# Patient Record
Sex: Male | Born: 1955 | Race: White | Hispanic: No
Health system: Southern US, Community
[De-identification: ages and names within clinical notes are randomized; demographics above are authoritative.]

## PROBLEM LIST (undated history)

## (undated) DIAGNOSIS — J9621 Acute and chronic respiratory failure with hypoxia: Secondary | ICD-10-CM

## (undated) DIAGNOSIS — I480 Paroxysmal atrial fibrillation: Secondary | ICD-10-CM

## (undated) DIAGNOSIS — N170 Acute kidney failure with tubular necrosis: Secondary | ICD-10-CM

## (undated) DIAGNOSIS — J850 Gangrene and necrosis of lung: Secondary | ICD-10-CM

## (undated) DIAGNOSIS — K4030 Unilateral inguinal hernia, with obstruction, without gangrene, not specified as recurrent: Secondary | ICD-10-CM

---

## 2018-12-14 ENCOUNTER — Encounter: Payer: Self-pay | Admitting: Internal Medicine

## 2018-12-14 ENCOUNTER — Other Ambulatory Visit (HOSPITAL_COMMUNITY): Payer: Self-pay

## 2018-12-14 ENCOUNTER — Inpatient Hospital Stay
Admission: AD | Admit: 2018-12-14 | Discharge: 2019-02-04 | Disposition: A | Discharge status: Other Acute Inpatient Hospital | Payer: BLUE CROSS/BLUE SHIELD | Source: Other Acute Inpatient Hospital | Attending: Internal Medicine | Admitting: Internal Medicine

## 2018-12-14 DIAGNOSIS — Z95828 Presence of other vascular implants and grafts: Secondary | ICD-10-CM

## 2018-12-14 DIAGNOSIS — I480 Paroxysmal atrial fibrillation: Secondary | ICD-10-CM | POA: Diagnosis present

## 2018-12-14 DIAGNOSIS — Z09 Encounter for follow-up examination after completed treatment for conditions other than malignant neoplasm: Secondary | ICD-10-CM

## 2018-12-14 DIAGNOSIS — R6520 Severe sepsis without septic shock: Secondary | ICD-10-CM

## 2018-12-14 DIAGNOSIS — A419 Sepsis, unspecified organism: Secondary | ICD-10-CM

## 2018-12-14 DIAGNOSIS — R188 Other ascites: Secondary | ICD-10-CM

## 2018-12-14 DIAGNOSIS — J9811 Atelectasis: Secondary | ICD-10-CM

## 2018-12-14 DIAGNOSIS — J90 Pleural effusion, not elsewhere classified: Secondary | ICD-10-CM

## 2018-12-14 DIAGNOSIS — J850 Gangrene and necrosis of lung: Secondary | ICD-10-CM | POA: Diagnosis not present

## 2018-12-14 DIAGNOSIS — R0902 Hypoxemia: Secondary | ICD-10-CM

## 2018-12-14 DIAGNOSIS — R0603 Acute respiratory distress: Secondary | ICD-10-CM

## 2018-12-14 DIAGNOSIS — J9690 Respiratory failure, unspecified, unspecified whether with hypoxia or hypercapnia: Secondary | ICD-10-CM

## 2018-12-14 DIAGNOSIS — N170 Acute kidney failure with tubular necrosis: Secondary | ICD-10-CM | POA: Diagnosis present

## 2018-12-14 DIAGNOSIS — J9621 Acute and chronic respiratory failure with hypoxia: Secondary | ICD-10-CM | POA: Diagnosis present

## 2018-12-14 DIAGNOSIS — K4030 Unilateral inguinal hernia, with obstruction, without gangrene, not specified as recurrent: Secondary | ICD-10-CM | POA: Diagnosis present

## 2018-12-14 DIAGNOSIS — J189 Pneumonia, unspecified organism: Secondary | ICD-10-CM

## 2018-12-14 DIAGNOSIS — Z0189 Encounter for other specified special examinations: Secondary | ICD-10-CM

## 2018-12-14 DIAGNOSIS — Z931 Gastrostomy status: Secondary | ICD-10-CM

## 2018-12-14 DIAGNOSIS — T85598A Other mechanical complication of other gastrointestinal prosthetic devices, implants and grafts, initial encounter: Secondary | ICD-10-CM

## 2018-12-14 DIAGNOSIS — J80 Acute respiratory distress syndrome: Secondary | ICD-10-CM

## 2018-12-14 HISTORY — DX: Acute kidney failure with tubular necrosis: N17.0

## 2018-12-14 HISTORY — DX: Paroxysmal atrial fibrillation: I48.0

## 2018-12-14 HISTORY — DX: Unilateral inguinal hernia, with obstruction, without gangrene, not specified as recurrent: K40.30

## 2018-12-14 HISTORY — DX: Acute and chronic respiratory failure with hypoxia: J96.21

## 2018-12-14 HISTORY — DX: Gangrene and necrosis of lung: J85.0

## 2018-12-14 MED ORDER — CHLORHEXIDINE GLUCONATE 0.12 % MT SOLN
15.00 | OROMUCOSAL | Status: DC
Start: ? — End: 2018-12-14

## 2018-12-14 MED ORDER — ARFORMOTEROL TARTRATE 15 MCG/2ML IN NEBU
15.00 | INHALATION_SOLUTION | RESPIRATORY_TRACT | Status: DC
Start: 2018-12-14 — End: 2018-12-14

## 2018-12-14 MED ORDER — GENERIC EXTERNAL MEDICATION
Status: DC
Start: 2018-12-14 — End: 2018-12-14

## 2018-12-14 MED ORDER — GENERIC EXTERNAL MEDICATION
320.00 | Status: DC
Start: 2018-12-14 — End: 2018-12-14

## 2018-12-14 MED ORDER — PHENOL 1.4 % MT LIQD
1.00 | OROMUCOSAL | Status: DC
Start: ? — End: 2018-12-14

## 2018-12-14 MED ORDER — IPRATROPIUM BROMIDE 0.02 % IN SOLN
0.50 | RESPIRATORY_TRACT | Status: DC
Start: 2018-12-14 — End: 2018-12-14

## 2018-12-14 MED ORDER — BUDESONIDE 0.5 MG/2ML IN SUSP
0.50 | RESPIRATORY_TRACT | Status: DC
Start: 2018-12-14 — End: 2018-12-14

## 2018-12-14 MED ORDER — GENERIC EXTERNAL MEDICATION
Status: DC
Start: ? — End: 2018-12-14

## 2018-12-14 MED ORDER — SODIUM CHLORIDE FLUSH 0.9 % IV SOLN
20.00 | INTRAVENOUS | Status: DC
Start: 2018-12-15 — End: 2018-12-14

## 2018-12-14 MED ORDER — AMIODARONE HCL 200 MG PO TABS
200.00 | ORAL_TABLET | ORAL | Status: DC
Start: 2018-12-15 — End: 2018-12-14

## 2018-12-14 MED ORDER — ACETAMINOPHEN 650 MG/20.3ML PO SOLN
650.00 | ORAL | Status: DC
Start: ? — End: 2018-12-14

## 2018-12-14 MED ORDER — SODIUM CHLORIDE FLUSH 0.9 % IV SOLN
20.00 | INTRAVENOUS | Status: DC
Start: ? — End: 2018-12-14

## 2018-12-14 MED ORDER — GENERIC EXTERNAL MEDICATION
15.00 | Status: DC
Start: 2018-12-14 — End: 2018-12-14

## 2018-12-14 MED ORDER — ONDANSETRON HCL 4 MG/2ML IJ SOLN
4.00 | INTRAMUSCULAR | Status: DC
Start: ? — End: 2018-12-14

## 2018-12-14 MED ORDER — GENERIC EXTERNAL MEDICATION
40.00 | Status: DC
Start: 2018-12-15 — End: 2018-12-14

## 2018-12-14 MED ORDER — HYDROCORTISONE NA SUCCINATE PF 100 MG IJ SOLR
50.00 | INTRAMUSCULAR | Status: DC
Start: 2018-12-14 — End: 2018-12-14

## 2018-12-14 MED ORDER — ENOXAPARIN SODIUM 40 MG/0.4ML ~~LOC~~ SOLN
40.00 | SUBCUTANEOUS | Status: DC
Start: 2018-12-15 — End: 2018-12-14

## 2018-12-14 MED ORDER — CHLORHEXIDINE GLUCONATE 0.12 % MT SOLN
15.00 | OROMUCOSAL | Status: DC
Start: 2018-12-14 — End: 2018-12-14

## 2018-12-14 NOTE — Consult Note (Addendum)
Pulmonary Critical Care Medicine [facility] [place]  PULMONARY SERVICE  Date of Service: 12/14/2018  PULMONARY CRITICAL CARE CONSULT   [name]  UUE:[id number]  DOB: July 23, 1956   DOA: 12/14/2018  Referring Physician: Carron Curie, MD  HPI: Benjamin [name] is a 63 y.o. male seen for follow up of Acute on Chronic Respiratory Failure.  Patient has multiple medical problems apparently was then seen in the hospital with the swelling and pain in the right groin.  Patient was noted at that time to be constipated without flatus.  Patient was attempted at having an NG tube placed however suffered an aspiration pneumonia large-volume.  Subsequently was transferred to the [facility]  Patient was taken to the OR had repair of the hernia postoperative course was somewhat complicated with development of severe ARDS secondary to the initial aspiration event.  Patient required mechanical ventilation was orally intubated ultimately ended up having to have a tracheostomy for failure to wean off the ventilator.  Patient also had an echocardiogram done which showed an ejection fraction of 45%.  Other complications included development of atrial fibrillation requiring amiodarone which is now rate controlled.  Patient also became septic and was treated with antibiotics.  CT scan of the chest was done to rule out pulmonary embolism however patient was noted to have necrotizing pneumonia and it was negative for pulmonary embolism right now patient is on the ventilator on pressure support mode has been on 60% oxygen on 12/8  Review of Systems:  ROS performed and is unremarkable other than noted above.  Past medical history: COPD Respiratory failure Necrotizing pneumonia Incarcerated hernia Sepsis Acute renal failure Atrial fibrillation  Past surgical history: Repair of inguinal hernia Tracheostomy PICC line placement  Social history: Unknown about smoking or drinking.  Family  history: Unknown  Allergies: Reviewed on the electronic chart  Medications: Reviewed on Rounds  Physical Exam:  Vitals: Temperature 97.0 pulse 92 respiratory rate 22 blood pressure is 104/54 saturations 92%  Ventilator Settings mode of ventilation pressure support FiO2 60% tidal volume 671 pressure poor 12 PEEP 8  . General: Comfortable at this time . Eyes: Grossly normal lids, irises & conjunctiva . ENT: grossly tongue is normal . Neck: no obvious mass . Cardiovascular: S1-S2 normal no gallop or rub is noted . Respiratory: Coarse breath sounds are noted bilaterally . Abdomen: Soft and nontender . Skin: no rash seen on limited exam . Musculoskeletal: not rigid . Psychiatric:unable to assess . Neurologic: no seizure no involuntary movements         Labs on Admission:  Basic Metabolic Panel: No results for input(s): NA, K, CL, CO2, GLUCOSE, BUN, CREATININE, CALCIUM, MG, PHOS in the last 168 hours.  No results for input(s): PHART, PCO2ART, PO2ART, HCO3, O2SAT in the last 168 hours.  Liver Function Tests: No results for input(s): AST, ALT, ALKPHOS, BILITOT, PROT, ALBUMIN in the last 168 hours. No results for input(s): LIPASE, AMYLASE in the last 168 hours. No results for input(s): AMMONIA in the last 168 hours.  CBC: No results for input(s): WBC, NEUTROABS, HGB, HCT, MCV, PLT in the last 168 hours.  Cardiac Enzymes: No results for input(s): CKTOTAL, CKMB, CKMBINDEX, TROPONINI in the last 168 hours.  BNP (last 3 results) No results for input(s): BNP in the last 8760 hours.  ProBNP (last 3 results) No results for input(s): PROBNP in the last 8760 hours.   Radiological Exams on Admission: Dg Chest Port 1 View  Result Date: 12/14/2018 CLINICAL DATA:  Min respiratory failure. EXAM: PORTABLE CHEST 1 VIEW COMPARISON:  None. FINDINGS: The patient is rotated to the left. Tracheostomy tube tip at the thoracic inlet. Feeding tube tip seen in the stomach. Normal heart size.  Normal mediastinal contours. Pulmonary vascular congestion with mild peripheral interstitial thickening in the left mid to lower lung. Hazy opacities at both lung bases. No pneumothorax. No acute osseous abnormality. IMPRESSION: 1. Hazy opacities at both lung bases likely reflect a combination of atelectasis and/or infiltrates with small pleural effusions. Electronically Signed   By: Obie DredgeWilliam T Derry M.D.   On: 12/14/2018 15:11   Dg Abd Portable 1v  Result Date: 12/14/2018 CLINICAL DATA:  Feeding tube placement. EXAM: PORTABLE ABDOMEN - 1 VIEW COMPARISON:  None. FINDINGS: Feeding tube within the stomach. The bowel gas pattern is normal. No radio-opaque calculi or other significant radiographic abnormality are seen. No acute osseous abnormality. IMPRESSION: Feeding tube in the stomach. Electronically Signed   By: Obie DredgeWilliam T Derry M.D.   On: 12/14/2018 15:12    Assessment/Plan Active Problems:   Acute on chronic respiratory failure with hypoxia (HCC)   Acute renal failure due to tubular necrosis (HCC)   Strangulated inguinal hernia   Necrotizing pneumonia (HCC)   Paroxysmal atrial fibrillation with rapid ventricular response (HCC)   ARDS (adult respiratory distress syndrome) (HCC)   Severe sepsis (HCC)   1. Acute on chronic respiratory failure with hypoxia patient was started on pressure support wean right now however oxygen requirements are 60% currently patient is on a pressure support 12/8.  Continue with him titrating oxygen down as tolerated follow ABG as necessary. 2. Acute tubular necrosis with kidney injury.  We will continue to follow the patient's labs current labs are pending 3. Strangulated inguinal hernia status post repair 4. Necrotizing pneumonia patient was treated with antibiotics.  The last chest x-ray showing hazy opacities in both lung bases atelectasis small infiltrate possibly. 5. Atrial fibrillation paroxysmal patient had rapid ventricular response right now the rate is  controlled we will need to continue to monitor closely 6. ARDS patient is on the ventilator however still on 60% FiO2 continue to monitor  I have personally seen and evaluated the patient, evaluated laboratory and imaging results, formulated the assessment and plan and placed orders. The Patient requires high complexity decision making for assessment and support.  Case was discussed on Rounds with the Respiratory Therapy Staff Time Spent 70minutes  Yevonne PaxSaadat A Geraldyne Barraclough, MD [facility] Pulmonary Critical Care Medicine Sleep Medicine

## 2018-12-15 ENCOUNTER — Other Ambulatory Visit (HOSPITAL_COMMUNITY): Payer: Self-pay

## 2018-12-15 DIAGNOSIS — N170 Acute kidney failure with tubular necrosis: Secondary | ICD-10-CM | POA: Diagnosis not present

## 2018-12-15 DIAGNOSIS — J9621 Acute and chronic respiratory failure with hypoxia: Secondary | ICD-10-CM | POA: Diagnosis not present

## 2018-12-15 DIAGNOSIS — J80 Acute respiratory distress syndrome: Secondary | ICD-10-CM | POA: Diagnosis not present

## 2018-12-15 DIAGNOSIS — J850 Gangrene and necrosis of lung: Secondary | ICD-10-CM | POA: Diagnosis not present

## 2018-12-15 LAB — COMPREHENSIVE METABOLIC PANEL
ALT: 57 U/L — ABNORMAL HIGH (ref 0–44)
AST: 24 U/L (ref 15–41)
Albumin: 1.8 g/dL — ABNORMAL LOW (ref 3.5–5.0)
Alkaline Phosphatase: 91 U/L (ref 38–126)
Anion gap: 11 (ref 5–15)
BUN: 53 mg/dL — ABNORMAL HIGH (ref 8–23)
CO2: 23 mmol/L (ref 22–32)
Calcium: 7.7 mg/dL — ABNORMAL LOW (ref 8.9–10.3)
Chloride: 118 mmol/L — ABNORMAL HIGH (ref 98–111)
Creatinine, Ser: 0.92 mg/dL (ref 0.61–1.24)
GFR calc Af Amer: >60 mL/min (ref >60)
GFR calc non Af Amer: >60 mL/min (ref >60)
Glucose, Bld: 122 mg/dL — ABNORMAL HIGH (ref 70–99)
Potassium: 3.4 mmol/L — ABNORMAL LOW (ref 3.5–5.1)
Sodium: 152 mmol/L — ABNORMAL HIGH (ref 135–145)
Total Bilirubin: 0.2 mg/dL — ABNORMAL LOW (ref 0.3–1.2)
Total Protein: 4.6 g/dL — ABNORMAL LOW (ref 6.5–8.1)

## 2018-12-15 LAB — CBC
HCT: 27.4 % — ABNORMAL LOW (ref 39.0–52.0)
Hemoglobin: 8 g/dL — ABNORMAL LOW (ref 13.0–17.0)
MCH: 30.1 pg (ref 26.0–34.0)
MCHC: 29.2 g/dL — ABNORMAL LOW (ref 30.0–36.0)
MCV: 103 fL — ABNORMAL HIGH (ref 80.0–100.0)
Platelets: 224 10*3/uL (ref 150–400)
RBC: 2.66 MIL/uL — ABNORMAL LOW (ref 4.22–5.81)
RDW: 15.9 % — ABNORMAL HIGH (ref 11.5–15.5)
WBC: 5.9 10*3/uL (ref 4.0–10.5)
nRBC: 0 % (ref 0.0–0.2)

## 2018-12-15 LAB — C DIFFICILE QUICK SCREEN W PCR REFLEX
C Diff antigen: NEGATIVE
C Diff interpretation: NOT DETECTED
C Diff toxin: NEGATIVE

## 2018-12-15 MED ORDER — IOPAMIDOL (ISOVUE-300) INJECTION 61%
100.0000 mL | Freq: Once | INTRAVENOUS | Status: AC | PRN
  Administered 2018-12-15: 14:00:00 100 mL via INTRAVENOUS

## 2018-12-15 NOTE — Progress Notes (Addendum)
Pulmonary Critical Care Medicine [facility] [place]   PULMONARY CRITICAL CARE SERVICE  PROGRESS NOTE  Date of Service: 12/15/2018  Benjamin [name]  PJA:[id number]  DOB: 06-03-1956   DOA: 12/14/2018  Referring Physician: Carron Curie, MD  HPI: Benjamin [name] is a 63 y.o. male seen for follow up of Acute on Chronic Respiratory Failure.  Patient remains on full support on the ventilator.  Currently requiring 60% FiO2.  Comfortable time with no distress.  Medications: Reviewed on Rounds  Physical Exam:  Vitals: Pulse 86 respirations 22 BP 90/48 O2 sat 91%  Ventilator Settings ventilator mode AC VC rate of 18 tidal line 450 PEEP of 8 FiO2 60%  . General: Comfortable at this time . Eyes: Grossly normal lids, irises & conjunctiva . ENT: grossly tongue is normal . Neck: no obvious mass . Cardiovascular: S1 S2 normal no gallop . Respiratory: Coarse breath sounds . Abdomen: soft . Skin: no rash seen on limited exam . Musculoskeletal: not rigid . Psychiatric:unable to assess . Neurologic: no seizure no involuntary movements         Lab Data:   Basic Metabolic Panel: Recent Labs  Lab 12/15/18 0710  NA 152*  K 3.4*  CL 118*  CO2 23  GLUCOSE 122*  BUN 53*  CREATININE 0.92  CALCIUM 7.7*    ABG: No results for input(s): PHART, PCO2ART, PO2ART, HCO3, O2SAT in the last 168 hours.  Liver Function Tests: Recent Labs  Lab 12/15/18 0710  AST 24  ALT 57*  ALKPHOS 91  BILITOT 0.2*  PROT 4.6*  ALBUMIN 1.8*   No results for input(s): LIPASE, AMYLASE in the last 168 hours. No results for input(s): AMMONIA in the last 168 hours.  CBC: Recent Labs  Lab 12/15/18 0710  WBC 5.9  HGB 8.0*  HCT 27.4*  MCV 103.0*  PLT 224    Cardiac Enzymes: No results for input(s): CKTOTAL, CKMB, CKMBINDEX, TROPONINI in the last 168 hours.  BNP (last 3 results) No results for input(s): BNP in the last 8760 hours.  ProBNP (last 3 results) No results for input(s): PROBNP in  the last 8760 hours.  Radiological Exams: Ct Abdomen Pelvis W Contrast  Result Date: 12/15/2018 CLINICAL DATA:  63 year old male status post incarcerated hernia repair. EXAM: CT ABDOMEN AND PELVIS WITH CONTRAST TECHNIQUE: Multidetector CT imaging of the abdomen and pelvis was performed using the standard protocol following bolus administration of intravenous contrast. CONTRAST:  ISOVUE-300 IOPAMIDOL (ISOVUE-300) INJECTION 61% COMPARISON:  None. FINDINGS: Lower chest: Small bilateral layering pleural effusions. There is associated bilateral lower lobe atelectasis. The remaining lungs are relatively clear. Incompletely imaged cardiac structures. The visualized distal esophagus conveys a feeding tube into the stomach. Hepatobiliary: Normal hepatic contour and morphology. No discrete hepatic lesion. Mild periportal edema. No biliary ductal dilatation. The gallbladder is collapsed and thick walled. Pancreas: Unremarkable. No pancreatic ductal dilatation or surrounding inflammatory changes. Spleen: Normal in size without focal abnormality. Adrenals/Urinary Tract: No evidence of hydronephrosis, or nephrolithiasis. Ureters are unremarkable. A Foley catheter is present in the collapsed bladder. Stomach/Bowel: A rectal catheter is present. No evidence of bowel obstruction or focal bowel wall thickening. The bowel is largely opacified with contrast material. The tip of the feeding tube is in the proximal jejunum. Vascular/Lymphatic: No evidence of aneurysm. Atherosclerotic calcifications present along the abdominal aorta. No suspicious lymphadenopathy. Reproductive: Prostate is unremarkable. Other: Diffuse reticulation of the subcutaneous fat consistent with anasarca. Additionally, there is small volume ascites in the upper abdomen  and in the pelvis. A pocket of fluid within the pelvic recess measures approximately 9.2 by 7.1 cm. There is some subtle enhancement of the adjacent peritoneal lining. Surgical changes of  open midline and right lower quadrant incisions are present. Musculoskeletal: No acute fracture or aggressive appearing lytic or blastic osseous lesion. IMPRESSION: 1. Small bilateral layering pleural effusions, body wall anasarca, small volume ascites and mesenteric edema suggest either third-spacing, volume overload or heart failure. 2. Somewhat loculated fluid within the pelvic recess demonstrates subtle enhancement of the peritoneal lining. This suggests either the presence of blood products, or in the appropriate clinical setting, developing abscess. 3. No evidence of ileus or bowel obstruction. 4. Open midline abdominal and right lower quadrant wound. 5. The tip of the transpyloric feeding tube is well positioned in the proximal jejunum. 6. Aortic Atherosclerosis (ICD10-170.0). Electronically Signed   By: Malachy MoanHeath  McCullough M.D.   On: 12/15/2018 13:57   Dg Chest Port 1 View  Result Date: 12/14/2018 CLINICAL DATA:  Min respiratory failure. EXAM: PORTABLE CHEST 1 VIEW COMPARISON:  None. FINDINGS: The patient is rotated to the left. Tracheostomy tube tip at the thoracic inlet. Feeding tube tip seen in the stomach. Normal heart size. Normal mediastinal contours. Pulmonary vascular congestion with mild peripheral interstitial thickening in the left mid to lower lung. Hazy opacities at both lung bases. No pneumothorax. No acute osseous abnormality. IMPRESSION: 1. Hazy opacities at both lung bases likely reflect a combination of atelectasis and/or infiltrates with small pleural effusions. Electronically Signed   By: Obie DredgeWilliam T Derry M.D.   On: 12/14/2018 15:11   Dg Abd Portable 1v  Result Date: 12/14/2018 CLINICAL DATA:  Feeding tube placement. EXAM: PORTABLE ABDOMEN - 1 VIEW COMPARISON:  None. FINDINGS: Feeding tube within the stomach. The bowel gas pattern is normal. No radio-opaque calculi or other significant radiographic abnormality are seen. No acute osseous abnormality. IMPRESSION: Feeding tube in the  stomach. Electronically Signed   By: Obie DredgeWilliam T Derry M.D.   On: 12/14/2018 15:12    Assessment/Plan Active Problems:   Acute on chronic respiratory failure with hypoxia (HCC)   Acute renal failure due to tubular necrosis (HCC)   Strangulated inguinal hernia   Necrotizing pneumonia (HCC)   Paroxysmal atrial fibrillation with rapid ventricular response (HCC)   ARDS (adult respiratory distress syndrome) (HCC)   Severe sepsis (HCC)   1. Acute on chronic respiratory failure with hypoxia continue to attempt weaning as patient tolerated.  Continue pulmonary toilet secretion management 2. Acute tubular necrosis with kidney injury continue to follow patient's labs 3. Strangulated inguinal hernia status post repair 4. Necrotizing pneumonia treated antibiotics continue to follow radiologically 5. Atrial fibrillation proximal patient had RVR currently controlled 6. ARDS patient on ventilator currently requiring 60% FiO2.   I have personally seen and evaluated the patient, evaluated laboratory and imaging results, formulated the assessment and plan and placed orders. The Patient requires high complexity decision making for assessment and support.  Case was discussed on Rounds with the Respiratory Therapy Staff  Yevonne PaxSaadat A Suren Payne, MD [facility] Pulmonary Critical Care Medicine Sleep Medicine

## 2018-12-16 DIAGNOSIS — J850 Gangrene and necrosis of lung: Secondary | ICD-10-CM | POA: Diagnosis not present

## 2018-12-16 DIAGNOSIS — J80 Acute respiratory distress syndrome: Secondary | ICD-10-CM | POA: Diagnosis not present

## 2018-12-16 DIAGNOSIS — N170 Acute kidney failure with tubular necrosis: Secondary | ICD-10-CM | POA: Diagnosis not present

## 2018-12-16 DIAGNOSIS — J9621 Acute and chronic respiratory failure with hypoxia: Secondary | ICD-10-CM | POA: Diagnosis not present

## 2018-12-16 NOTE — Progress Notes (Addendum)
Pulmonary Critical Care Medicine [facility] [place]   PULMONARY CRITICAL CARE SERVICE  PROGRESS NOTE  Date of Service: 12/16/2018  Benjamin LedgerRick E [name]  UEA:[id number]  DOB: 1956/02/13   DOA: 12/14/2018  Referring Physician: Carron CurieAli Hijazi, MD  HPI: Benjamin [name] is a 63 y.o. male seen for follow up of Acute on Chronic Respiratory Failure.  Patient continues on full support at this time.  Currently requiring a rate of 18 and FiO2 of 50%.  Medications: Reviewed on Rounds  Physical Exam:  Vitals: Pulse 91 respirations 23 BP 114/51 O2 sat 94% temp 96.5  Ventilator Settings ventilator mode AC VC rate of 18 tidal volume 450 PEEP of 8 FiO2 50%  . General: Comfortable at this time . Eyes: Grossly normal lids, irises & conjunctiva . ENT: grossly tongue is normal . Neck: no obvious mass . Cardiovascular: S1 S2 normal no gallop . Respiratory: Coarse breath sounds . Abdomen: soft . Skin: no rash seen on limited exam . Musculoskeletal: not rigid . Psychiatric:unable to assess . Neurologic: no seizure no involuntary movements         Lab Data:   Basic Metabolic Panel: Recent Labs  Lab 12/15/18 0710  NA 152*  K 3.4*  CL 118*  CO2 23  GLUCOSE 122*  BUN 53*  CREATININE 0.92  CALCIUM 7.7*    ABG: No results for input(s): PHART, PCO2ART, PO2ART, HCO3, O2SAT in the last 168 hours.  Liver Function Tests: Recent Labs  Lab 12/15/18 0710  AST 24  ALT 57*  ALKPHOS 91  BILITOT 0.2*  PROT 4.6*  ALBUMIN 1.8*   No results for input(s): LIPASE, AMYLASE in the last 168 hours. No results for input(s): AMMONIA in the last 168 hours.  CBC: Recent Labs  Lab 12/15/18 0710  WBC 5.9  HGB 8.0*  HCT 27.4*  MCV 103.0*  PLT 224    Cardiac Enzymes: No results for input(s): CKTOTAL, CKMB, CKMBINDEX, TROPONINI in the last 168 hours.  BNP (last 3 results) No results for input(s): BNP in the last 8760 hours.  ProBNP (last 3 results) No results for input(s): PROBNP in the  last 8760 hours.  Radiological Exams: Ct Abdomen Pelvis W Contrast  Result Date: 12/15/2018 CLINICAL DATA:  63 year old male status post incarcerated hernia repair. EXAM: CT ABDOMEN AND PELVIS WITH CONTRAST TECHNIQUE: Multidetector CT imaging of the abdomen and pelvis was performed using the standard protocol following bolus administration of intravenous contrast. CONTRAST:  100mL ISOVUE-300 IOPAMIDOL (ISOVUE-300) INJECTION 61% COMPARISON:  None. FINDINGS: Lower chest: Small bilateral layering pleural effusions. There is associated bilateral lower lobe atelectasis. The remaining lungs are relatively clear. Incompletely imaged cardiac structures. The visualized distal esophagus conveys a feeding tube into the stomach. Hepatobiliary: Normal hepatic contour and morphology. No discrete hepatic lesion. Mild periportal edema. No biliary ductal dilatation. The gallbladder is collapsed and thick walled. Pancreas: Unremarkable. No pancreatic ductal dilatation or surrounding inflammatory changes. Spleen: Normal in size without focal abnormality. Adrenals/Urinary Tract: No evidence of hydronephrosis, or nephrolithiasis. Ureters are unremarkable. A Foley catheter is present in the collapsed bladder. Stomach/Bowel: A rectal catheter is present. No evidence of bowel obstruction or focal bowel wall thickening. The bowel is largely opacified with contrast material. The tip of the feeding tube is in the proximal jejunum. Vascular/Lymphatic: No evidence of aneurysm. Atherosclerotic calcifications present along the abdominal aorta. No suspicious lymphadenopathy. Reproductive: Prostate is unremarkable. Other: Diffuse reticulation of the subcutaneous fat consistent with anasarca. Additionally, there is small volume ascites in the  upper abdomen and in the pelvis. A pocket of fluid within the pelvic recess measures approximately 9.2 by 7.1 cm. There is some subtle enhancement of the adjacent peritoneal lining. Surgical changes of open  midline and right lower quadrant incisions are present. Musculoskeletal: No acute fracture or aggressive appearing lytic or blastic osseous lesion. IMPRESSION: 1. Small bilateral layering pleural effusions, body wall anasarca, small volume ascites and mesenteric edema suggest either third-spacing, volume overload or heart failure. 2. Somewhat loculated fluid within the pelvic recess demonstrates subtle enhancement of the peritoneal lining. This suggests either the presence of blood products, or in the appropriate clinical setting, developing abscess. 3. No evidence of ileus or bowel obstruction. 4. Open midline abdominal and right lower quadrant wound. 5. The tip of the transpyloric feeding tube is well positioned in the proximal jejunum. 6. Aortic Atherosclerosis (ICD10-170.0). Electronically Signed   By: Malachy Moan M.D.   On: 12/15/2018 13:57   Dg Chest Port 1 View  Result Date: 12/14/2018 CLINICAL DATA:  Min respiratory failure. EXAM: PORTABLE CHEST 1 VIEW COMPARISON:  None. FINDINGS: The patient is rotated to the left. Tracheostomy tube tip at the thoracic inlet. Feeding tube tip seen in the stomach. Normal heart size. Normal mediastinal contours. Pulmonary vascular congestion with mild peripheral interstitial thickening in the left mid to lower lung. Hazy opacities at both lung bases. No pneumothorax. No acute osseous abnormality. IMPRESSION: 1. Hazy opacities at both lung bases likely reflect a combination of atelectasis and/or infiltrates with small pleural effusions. Electronically Signed   By: Obie Dredge M.D.   On: 12/14/2018 15:11   Dg Abd Portable 1v  Result Date: 12/14/2018 CLINICAL DATA:  Feeding tube placement. EXAM: PORTABLE ABDOMEN - 1 VIEW COMPARISON:  None. FINDINGS: Feeding tube within the stomach. The bowel gas pattern is normal. No radio-opaque calculi or other significant radiographic abnormality are seen. No acute osseous abnormality. IMPRESSION: Feeding tube in the stomach.  Electronically Signed   By: Obie Dredge M.D.   On: 12/14/2018 15:12    Assessment/Plan Active Problems:   Acute on chronic respiratory failure with hypoxia (HCC)   Acute renal failure due to tubular necrosis (HCC)   Strangulated inguinal hernia   Necrotizing pneumonia (HCC)   Paroxysmal atrial fibrillation with rapid ventricular response (HCC)   ARDS (adult respiratory distress syndrome) (HCC)   Severe sepsis (HCC)   1. Acute on chronic respiratory failure with hypoxia continue to attempt weaning as tolerated.  Continue secretion management and pulmonary toilet as well as supportive measures. 2. Acute tubular necrosis with kidney injury continue to follow patient's labs 3. Strangulated inguinal hernia status post repair 4. Necrotizing pneumonia treated antibiotics continue to follow 5. Atrial fibrillation with RVR currently controlled 6. ARDS patient on ventilator currently requiring 50% FiO2   I have personally seen and evaluated the patient, evaluated laboratory and imaging results, formulated the assessment and plan and placed orders. The Patient requires high complexity decision making for assessment and support.  Case was discussed on Rounds with the Respiratory Therapy Staff  Yevonne Pax, MD [facility] Pulmonary Critical Care Medicine Sleep Medicine

## 2018-12-17 DIAGNOSIS — J80 Acute respiratory distress syndrome: Secondary | ICD-10-CM | POA: Diagnosis not present

## 2018-12-17 DIAGNOSIS — N170 Acute kidney failure with tubular necrosis: Secondary | ICD-10-CM | POA: Diagnosis not present

## 2018-12-17 DIAGNOSIS — J850 Gangrene and necrosis of lung: Secondary | ICD-10-CM | POA: Diagnosis not present

## 2018-12-17 DIAGNOSIS — J9621 Acute and chronic respiratory failure with hypoxia: Secondary | ICD-10-CM | POA: Diagnosis not present

## 2018-12-17 LAB — BASIC METABOLIC PANEL
Anion gap: 6 (ref 5–15)
BUN: 45 mg/dL — ABNORMAL HIGH (ref 8–23)
CO2: 23 mmol/L (ref 22–32)
Calcium: 7.6 mg/dL — ABNORMAL LOW (ref 8.9–10.3)
Chloride: 112 mmol/L — ABNORMAL HIGH (ref 98–111)
Creatinine, Ser: 1.07 mg/dL (ref 0.61–1.24)
GFR calc Af Amer: >60 mL/min (ref >60)
GFR calc non Af Amer: >60 mL/min (ref >60)
Glucose, Bld: 114 mg/dL — ABNORMAL HIGH (ref 70–99)
Potassium: 3.8 mmol/L (ref 3.5–5.1)
Sodium: 141 mmol/L (ref 135–145)

## 2018-12-17 NOTE — Progress Notes (Addendum)
Pulmonary Critical Care Medicine [facility] [place]   PULMONARY CRITICAL CARE SERVICE  PROGRESS NOTE  Date of Service: 12/17/2018  Benjamin [name]  HUT:[id number]  DOB: [birth year]   DOA: 12/14/2018  Referring Physician: Carron Curie, MD  HPI: Benjamin [name] is a 63 y.o. male seen for follow up of Acute on Chronic Respiratory Failure.  Patient remains on assist control mode on the ventilator rate of 18 FiO2 50%.  Currently on pressure support for an 8-hour goal today 12/5 with an FiO2 of 50%.  Was able to tolerate 4 hours yesterday with no difficulty.  Medications: Reviewed on Rounds  Physical Exam:  Vitals: Pulse 74 respirations 20 BP 107/50 O2 sat 91% temp 97.7  Ventilator Settings ventilator mode AC VC rate of 18 tidal volume 450 PEEP of 7 FiO2 50%  . General: Comfortable at this time . Eyes: Grossly normal lids, irises & conjunctiva . ENT: grossly tongue is normal . Neck: no obvious mass . Cardiovascular: S1 S2 normal no gallop . Respiratory: Coarse breath sounds . Abdomen: soft . Skin: no rash seen on limited exam . Musculoskeletal: not rigid . Psychiatric:unable to assess . Neurologic: no seizure no involuntary movements         Lab Data:   Basic Metabolic Panel: Recent Labs  Lab 12/15/18 0710 12/17/18 0339  NA 152* 141  K 3.4* 3.8  CL 118* 112*  CO2 23 23  GLUCOSE 122* 114*  BUN 53* 45*  CREATININE 0.92 1.07  CALCIUM 7.7* 7.6*    ABG: No results for input(s): PHART, PCO2ART, PO2ART, HCO3, O2SAT in the last 168 hours.  Liver Function Tests: Recent Labs  Lab 12/15/18 0710  AST 24  ALT 57*  ALKPHOS 91  BILITOT 0.2*  PROT 4.6*  ALBUMIN 1.8*   No results for input(s): LIPASE, AMYLASE in the last 168 hours. No results for input(s): AMMONIA in the last 168 hours.  CBC: Recent Labs  Lab 12/15/18 0710  WBC 5.9  HGB 8.0*  HCT 27.4*  MCV 103.0*  PLT 224    Cardiac Enzymes: No results for input(s): CKTOTAL, CKMB, CKMBINDEX, TROPONINI  in the last 168 hours.  BNP (last 3 results) No results for input(s): BNP in the last 8760 hours.  ProBNP (last 3 results) No results for input(s): PROBNP in the last 8760 hours.  Radiological Exams: Ct Abdomen Pelvis W Contrast  Result Date: 12/15/2018 CLINICAL DATA:  63 year old male status post incarcerated hernia repair. EXAM: CT ABDOMEN AND PELVIS WITH CONTRAST TECHNIQUE: Multidetector CT imaging of the abdomen and pelvis was performed using the standard protocol following bolus administration of intravenous contrast. CONTRAST:  ISOVUE-300 IOPAMIDOL (ISOVUE-300) INJECTION 61% COMPARISON:  None. FINDINGS: Lower chest: Small bilateral layering pleural effusions. There is associated bilateral lower lobe atelectasis. The remaining lungs are relatively clear. Incompletely imaged cardiac structures. The visualized distal esophagus conveys a feeding tube into the stomach. Hepatobiliary: Normal hepatic contour and morphology. No discrete hepatic lesion. Mild periportal edema. No biliary ductal dilatation. The gallbladder is collapsed and thick walled. Pancreas: Unremarkable. No pancreatic ductal dilatation or surrounding inflammatory changes. Spleen: Normal in size without focal abnormality. Adrenals/Urinary Tract: No evidence of hydronephrosis, or nephrolithiasis. Ureters are unremarkable. A Foley catheter is present in the collapsed bladder. Stomach/Bowel: A rectal catheter is present. No evidence of bowel obstruction or focal bowel wall thickening. The bowel is largely opacified with contrast material. The tip of the feeding tube is in the proximal jejunum. Vascular/Lymphatic: No evidence of aneurysm.  Atherosclerotic calcifications present along the abdominal aorta. No suspicious lymphadenopathy. Reproductive: Prostate is unremarkable. Other: Diffuse reticulation of the subcutaneous fat consistent with anasarca. Additionally, there is small volume ascites in the upper abdomen and in the pelvis. A  pocket of fluid within the pelvic recess measures approximately 9.2 by 7.1 cm. There is some subtle enhancement of the adjacent peritoneal lining. Surgical changes of open midline and right lower quadrant incisions are present. Musculoskeletal: No acute fracture or aggressive appearing lytic or blastic osseous lesion. IMPRESSION: 1. Small bilateral layering pleural effusions, body wall anasarca, small volume ascites and mesenteric edema suggest either third-spacing, volume overload or heart failure. 2. Somewhat loculated fluid within the pelvic recess demonstrates subtle enhancement of the peritoneal lining. This suggests either the presence of blood products, or in the appropriate clinical setting, developing abscess. 3. No evidence of ileus or bowel obstruction. 4. Open midline abdominal and right lower quadrant wound. 5. The tip of the transpyloric feeding tube is well positioned in the proximal jejunum. 6. Aortic Atherosclerosis (ICD10-170.0). Electronically Signed   By: Malachy MoanHeath  McCullough M.D.   On: 12/15/2018 13:57    Assessment/Plan Active Problems:   Acute on chronic respiratory failure with hypoxia (HCC)   Acute renal failure due to tubular necrosis (HCC)   Strangulated inguinal hernia   Necrotizing pneumonia (HCC)   Paroxysmal atrial fibrillation with rapid ventricular response (HCC)   ARDS (adult respiratory distress syndrome) (HCC)   Severe sepsis (HCC)   1. Acute on chronic respiratory failure with hypoxia continue to attempt weaning as tolerated.  Goal of 8 hours on pressure support today.  Continue secretion management pulmonary toilet 2. Acute tubular necrosis with kidney injury continue to follow labs 3. Strangulated inguinal hernia status post repair 4. Necrotizing pneumonia treated continue to follow 5. Atrial fibrillation with RVR currently controlled 6. ARDS on ventilator currently requiring 50% FiO2   I have personally seen and evaluated the patient, evaluated laboratory  and imaging results, formulated the assessment and plan and placed orders. The Patient requires high complexity decision making for assessment and support.  Case was discussed on Rounds with the Respiratory Therapy Staff  Yevonne PaxSaadat A Caeden Foots, MD [facility] Pulmonary Critical Care Medicine Sleep Medicine

## 2018-12-18 DIAGNOSIS — J850 Gangrene and necrosis of lung: Secondary | ICD-10-CM | POA: Diagnosis not present

## 2018-12-18 DIAGNOSIS — J9621 Acute and chronic respiratory failure with hypoxia: Secondary | ICD-10-CM | POA: Diagnosis not present

## 2018-12-18 DIAGNOSIS — J80 Acute respiratory distress syndrome: Secondary | ICD-10-CM | POA: Diagnosis not present

## 2018-12-18 DIAGNOSIS — N170 Acute kidney failure with tubular necrosis: Secondary | ICD-10-CM | POA: Diagnosis not present

## 2018-12-18 LAB — BASIC METABOLIC PANEL
Anion gap: 8 (ref 5–15)
BUN: 42 mg/dL — ABNORMAL HIGH (ref 8–23)
CO2: 24 mmol/L (ref 22–32)
Calcium: 7.8 mg/dL — ABNORMAL LOW (ref 8.9–10.3)
Chloride: 109 mmol/L (ref 98–111)
Creatinine, Ser: 1.11 mg/dL (ref 0.61–1.24)
GFR calc Af Amer: >60 mL/min (ref >60)
GFR calc non Af Amer: >60 mL/min (ref >60)
Glucose, Bld: 107 mg/dL — ABNORMAL HIGH (ref 70–99)
Potassium: 4 mmol/L (ref 3.5–5.1)
Sodium: 141 mmol/L (ref 135–145)

## 2018-12-18 NOTE — Progress Notes (Addendum)
Pulmonary Critical Care Medicine [facility] [place]   PULMONARY CRITICAL CARE SERVICE  PROGRESS NOTE  Date of Service: 12/18/2018  Benjamin [name]  BEM:[id number]  DOB: [birth year]   DOA: 12/14/2018  Referring Physician: Carron Curie, MD  HPI: Benjamin [name] is a 63 y.o. male seen for follow up of Acute on Chronic Respiratory Failure.  Patient continues on assist control mode on the ventilator currently requiring a rate of 18 and an FiO2 of 70%.  Respiratory therapy has not weaned patient to pressure support today due to increased FiO2 overnight.  Medications: Reviewed on Rounds  Physical Exam:  Vitals: Pulse 73 respirations 20 BP 107/59 O2 sat 96% temp 96.4  Ventilator Settings ventilator mode AC VC rate of 18 tidal volume 450 PEEP of 7 FiO2 70%  . General: Comfortable at this time . Eyes: Grossly normal lids, irises & conjunctiva . ENT: grossly tongue is normal . Neck: no obvious mass . Cardiovascular: S1 S2 normal no gallop . Respiratory: Coarse breath sounds . Abdomen: soft . Skin: no rash seen on limited exam . Musculoskeletal: not rigid . Psychiatric:unable to assess . Neurologic: no seizure no involuntary movements         Lab Data:   Basic Metabolic Panel: Recent Labs  Lab 12/15/18 0710 12/17/18 0339 12/18/18 0349  NA 152* 141 141  K 3.4* 3.8 4.0  CL 118* 112* 109  CO2 23 23 24   GLUCOSE 122* 114* 107*  BUN 53* 45* 42*  CREATININE 0.92 1.07 1.11  CALCIUM 7.7* 7.6* 7.8*    ABG: No results for input(s): PHART, PCO2ART, PO2ART, HCO3, O2SAT in the last 168 hours.  Liver Function Tests: Recent Labs  Lab 12/15/18 0710  AST 24  ALT 57*  ALKPHOS 91  BILITOT 0.2*  PROT 4.6*  ALBUMIN 1.8*   No results for input(s): LIPASE, AMYLASE in the last 168 hours. No results for input(s): AMMONIA in the last 168 hours.  CBC: Recent Labs  Lab 12/15/18 0710  WBC 5.9  HGB 8.0*  HCT 27.4*  MCV 103.0*  PLT 224    Cardiac Enzymes: No results for  input(s): CKTOTAL, CKMB, CKMBINDEX, TROPONINI in the last 168 hours.  BNP (last 3 results) No results for input(s): BNP in the last 8760 hours.  ProBNP (last 3 results) No results for input(s): PROBNP in the last 8760 hours.  Radiological Exams: No results found.  Assessment/Plan Active Problems:   Acute on chronic respiratory failure with hypoxia (HCC)   Acute renal failure due to tubular necrosis (HCC)   Strangulated inguinal hernia   Necrotizing pneumonia (HCC)   Paroxysmal atrial fibrillation with rapid ventricular response (HCC)   ARDS (adult respiratory distress syndrome) (HCC)   Severe sepsis (HCC)   1. Acute on chronic respiratory failure with hypoxia continue to attempt weaning as tolerated.  Continue secretion management and pulmonary toilet.  When FiO2 is back to a reasonable level will continue with weaning to pressure support. 2. Acute tubular necrosis with kidney injury continue to follow labs 3. Strangulated inguinal hernia status post repair 4. Necrotizing pneumonia treated continue to follow 5. Atrial fibrillation with RVR currently controlled 6. ARDS on ventilator currently requiring 70% FiO2.   I have personally seen and evaluated the patient, evaluated laboratory and imaging results, formulated the assessment and plan and placed orders. The Patient requires high complexity decision making for assessment and support.  Case was discussed on Rounds with the Respiratory Therapy Staff  Yevonne Pax, MD [facility]  Pulmonary Critical Care Medicine Sleep Medicine

## 2018-12-19 ENCOUNTER — Other Ambulatory Visit (HOSPITAL_COMMUNITY): Payer: Self-pay

## 2018-12-19 DIAGNOSIS — J80 Acute respiratory distress syndrome: Secondary | ICD-10-CM | POA: Diagnosis not present

## 2018-12-19 DIAGNOSIS — N170 Acute kidney failure with tubular necrosis: Secondary | ICD-10-CM | POA: Diagnosis not present

## 2018-12-19 DIAGNOSIS — J9621 Acute and chronic respiratory failure with hypoxia: Secondary | ICD-10-CM | POA: Diagnosis not present

## 2018-12-19 DIAGNOSIS — J850 Gangrene and necrosis of lung: Secondary | ICD-10-CM | POA: Diagnosis not present

## 2018-12-19 NOTE — Progress Notes (Addendum)
Pulmonary Critical Care Medicine [facility] [place]   PULMONARY CRITICAL CARE SERVICE  PROGRESS NOTE  Date of Service: 12/19/2018  Benjamin [name]  DIY:[id number]  DOB: 01/02/1956   DOA: 12/14/2018  Referring Physician: Carron Curie, MD  HPI: Benjamin [name] is a 63 y.o. male seen for follow up of Acute on Chronic Respiratory Failure.  Patient remains on assist control mode with a rate of 18 and FiO2 of 50%.  Resting comfortably no distress at this time.  Medications: Reviewed on Rounds  Physical Exam:  Vitals: Pulse 78 respirations 25 BP 118/55 O2 sat 92% temp 96.3  Ventilator Settings ventilator mode AC VC rate of 18 tidal volume 450 PEEP of 7 FiO2 50%  . General: Comfortable at this time . Eyes: Grossly normal lids, irises & conjunctiva . ENT: grossly tongue is normal . Neck: no obvious mass . Cardiovascular: S1 S2 normal no gallop . Respiratory: Coarse breath sounds . Abdomen: soft . Skin: no rash seen on limited exam . Musculoskeletal: not rigid . Psychiatric:unable to assess . Neurologic: no seizure no involuntary movements         Lab Data:   Basic Metabolic Panel: Recent Labs  Lab 12/15/18 0710 12/17/18 0339 12/18/18 0349  NA 152* 141 141  K 3.4* 3.8 4.0  CL 118* 112* 109  CO2 23 23 24   GLUCOSE 122* 114* 107*  BUN 53* 45* 42*  CREATININE 0.92 1.07 1.11  CALCIUM 7.7* 7.6* 7.8*    ABG: No results for input(s): PHART, PCO2ART, PO2ART, HCO3, O2SAT in the last 168 hours.  Liver Function Tests: Recent Labs  Lab 12/15/18 0710  AST 24  ALT 57*  ALKPHOS 91  BILITOT 0.2*  PROT 4.6*  ALBUMIN 1.8*   No results for input(s): LIPASE, AMYLASE in the last 168 hours. No results for input(s): AMMONIA in the last 168 hours.  CBC: Recent Labs  Lab 12/15/18 0710  WBC 5.9  HGB 8.0*  HCT 27.4*  MCV 103.0*  PLT 224    Cardiac Enzymes: No results for input(s): CKTOTAL, CKMB, CKMBINDEX, TROPONINI in the last 168 hours.  BNP (last 3  results) No results for input(s): BNP in the last 8760 hours.  ProBNP (last 3 results) No results for input(s): PROBNP in the last 8760 hours.  Radiological Exams: Dg Chest Port 1 View  Result Date: 12/19/2018 CLINICAL DATA:  Respiratory distress EXAM: PORTABLE CHEST 1 VIEW COMPARISON:  12/14/2018 FINDINGS: The frontal projection was obtained on 3 images due to patient body habitus. Tracheostomy tube projects over the tracheal air column. A feeding tube coils in the upper abdomen that is probably in the fourth portion of the duodenum based on its curvature. Blunting of the costophrenic angles with hazy opacities at the lung bases compatible with layering pleural effusions. Left lateral rib irregularities compatible old rib fractures. Mild interstitial accentuation at the lung bases. IMPRESSION: 1. Bilateral layering pleural effusions with associated lower lobe atelectasis. 2. Faint interstitial accentuation at the lung bases. 3. Heart size within normal limits. Electronically Signed   By: Gaylyn Rong M.D.   On: 12/19/2018 13:40    Assessment/Plan Active Problems:   Acute on chronic respiratory failure with hypoxia (HCC)   Acute renal failure due to tubular necrosis (HCC)   Strangulated inguinal hernia   Necrotizing pneumonia (HCC)   Paroxysmal atrial fibrillation with rapid ventricular response (HCC)   ARDS (adult respiratory distress syndrome) (HCC)   Severe sepsis (HCC)   1. Acute chronic respiratory failure  with hypoxia continue to wean per protocol.  Continue pulmonary toilet secretion management 2. Acute tubular necrosis with kidney injury continue to follow labs 3. Strangulated inguinal hernia status post repair 4. Necrotizing pneumonia treated continue to follow 5. Atrial fibrillation with RVR currently controlled 6. ARDS on ventilator currently requiring 50% FiO2.   I have personally seen and evaluated the patient, evaluated laboratory and imaging results, formulated the  assessment and plan and placed orders. The Patient requires high complexity decision making for assessment and support.  Case was discussed on Rounds with the Respiratory Therapy Staff  Yevonne PaxSaadat A Lenton Gendreau, MD [facility] Pulmonary Critical Care Medicine Sleep Medicine

## 2018-12-20 ENCOUNTER — Other Ambulatory Visit (HOSPITAL_COMMUNITY): Payer: Self-pay

## 2018-12-20 DIAGNOSIS — J850 Gangrene and necrosis of lung: Secondary | ICD-10-CM | POA: Diagnosis not present

## 2018-12-20 DIAGNOSIS — J9621 Acute and chronic respiratory failure with hypoxia: Secondary | ICD-10-CM | POA: Diagnosis not present

## 2018-12-20 DIAGNOSIS — J80 Acute respiratory distress syndrome: Secondary | ICD-10-CM | POA: Diagnosis not present

## 2018-12-20 DIAGNOSIS — N170 Acute kidney failure with tubular necrosis: Secondary | ICD-10-CM | POA: Diagnosis not present

## 2018-12-20 LAB — BASIC METABOLIC PANEL
Anion gap: 8 (ref 5–15)
BUN: 40 mg/dL — ABNORMAL HIGH (ref 8–23)
CO2: 28 mmol/L (ref 22–32)
Calcium: 7.9 mg/dL — ABNORMAL LOW (ref 8.9–10.3)
Chloride: 110 mmol/L (ref 98–111)
Creatinine, Ser: 0.94 mg/dL (ref 0.61–1.24)
GFR calc Af Amer: >60 mL/min (ref >60)
GFR calc non Af Amer: >60 mL/min (ref >60)
Glucose, Bld: 108 mg/dL — ABNORMAL HIGH (ref 70–99)
Potassium: 3.9 mmol/L (ref 3.5–5.1)
Sodium: 146 mmol/L — ABNORMAL HIGH (ref 135–145)

## 2018-12-20 NOTE — Progress Notes (Addendum)
Pulmonary Critical Care Medicine [facility] [place]   PULMONARY CRITICAL CARE SERVICE  PROGRESS NOTE  Date of Service: 12/20/2018  RANULFO GEDDIE  ZOX:[id number]  DOB: [birth year]   DOA: 12/14/2018  Referring Physician: Carron Curie, MD  HPI: SPYROS BALKO is a 63 y.o. male seen for follow up of Acute on Chronic Respiratory Failure.  Patient remains on assist control mode on the ventilator.  Currently requiring 65% FiO2.  Medications: Reviewed on Rounds  Physical Exam:  Vitals: Pulse 80 respirations 19 BP 104/51 O2 sat 93% temp 98.3  Ventilator Settings ventilator mode AC VC rate of 18 tidal volume 450 PEEP of 7 FiO2 of 65%  . General: Comfortable at this time . Eyes: Grossly normal lids, irises & conjunctiva . ENT: grossly tongue is normal . Neck: no obvious mass . Cardiovascular: S1 S2 normal no gallop . Respiratory: Coarse breath sounds . Abdomen: soft . Skin: no rash seen on limited exam . Musculoskeletal: not rigid . Psychiatric:unable to assess . Neurologic: no seizure no involuntary movements         Lab Data:   Basic Metabolic Panel: Recent Labs  Lab 12/15/18 0710 12/17/18 0339 12/18/18 0349 12/20/18 0652  NA 152* 141 141 146*  K 3.4* 3.8 4.0 3.9  CL 118* 112* 109 110  CO2 23 23 24 28   GLUCOSE 122* 114* 107* 108*  BUN 53* 45* 42* 40*  CREATININE 0.92 1.07 1.11 0.94  CALCIUM 7.7* 7.6* 7.8* 7.9*    ABG: No results for input(s): PHART, PCO2ART, PO2ART, HCO3, O2SAT in the last 168 hours.  Liver Function Tests: Recent Labs  Lab 12/15/18 0710  AST 24  ALT 57*  ALKPHOS 91  BILITOT 0.2*  PROT 4.6*  ALBUMIN 1.8*   No results for input(s): LIPASE, AMYLASE in the last 168 hours. No results for input(s): AMMONIA in the last 168 hours.  CBC: Recent Labs  Lab 12/15/18 0710  WBC 5.9  HGB 8.0*  HCT 27.4*  MCV 103.0*  PLT 224    Cardiac Enzymes: No results for input(s): CKTOTAL, CKMB, CKMBINDEX, TROPONINI in the last 168  hours.  BNP (last 3 results) No results for input(s): BNP in the last 8760 hours.  ProBNP (last 3 results) No results for input(s): PROBNP in the last 8760 hours.  Radiological Exams: Dg Chest Port 1 View  Result Date: 12/19/2018 CLINICAL DATA:  Respiratory distress EXAM: PORTABLE CHEST 1 VIEW COMPARISON:  12/14/2018 FINDINGS: The frontal projection was obtained on 3 images due to patient body habitus. Tracheostomy tube projects over the tracheal air column. A feeding tube coils in the upper abdomen that is probably in the fourth portion of the duodenum based on its curvature. Blunting of the costophrenic angles with hazy opacities at the lung bases compatible with layering pleural effusions. Left lateral rib irregularities compatible old rib fractures. Mild interstitial accentuation at the lung bases. IMPRESSION: 1. Bilateral layering pleural effusions with associated lower lobe atelectasis. 2. Faint interstitial accentuation at the lung bases. 3. Heart size within normal limits. Electronically Signed   By: Gaylyn Rong M.D.   On: 12/19/2018 13:40    Assessment/Plan Active Problems:   Acute on chronic respiratory failure with hypoxia (HCC)   Acute renal failure due to tubular necrosis (HCC)   Strangulated inguinal hernia   Necrotizing pneumonia (HCC)   Paroxysmal atrial fibrillation with rapid ventricular response (HCC)   ARDS (adult respiratory distress syndrome) (HCC)   Severe sepsis (HCC)   1. Acute on  chronic respiratory failure with hypoxia continue to wean per protocol.  Continue pulmonary toilet secretion management 2. Acute tubular necrosis with kidney injury continue to follow labs 3. Strangulated inguinal hernia status post repair 4. Necrotizing pneumonia treated continue to follow 5. Atrial fibrillation with RVR continue current therapy 6. ARDS on ventilator currently requiring 65% FiO2   I have personally seen and evaluated the patient, evaluated laboratory and  imaging results, formulated the assessment and plan and placed orders. The Patient requires high complexity decision making for assessment and support.  Case was discussed on Rounds with the Respiratory Therapy Staff  Yevonne PaxSaadat A , MD [facility] Pulmonary Critical Care Medicine Sleep Medicine

## 2018-12-21 DIAGNOSIS — J850 Gangrene and necrosis of lung: Secondary | ICD-10-CM | POA: Diagnosis not present

## 2018-12-21 DIAGNOSIS — J80 Acute respiratory distress syndrome: Secondary | ICD-10-CM | POA: Diagnosis not present

## 2018-12-21 DIAGNOSIS — J9621 Acute and chronic respiratory failure with hypoxia: Secondary | ICD-10-CM | POA: Diagnosis not present

## 2018-12-21 DIAGNOSIS — N170 Acute kidney failure with tubular necrosis: Secondary | ICD-10-CM | POA: Diagnosis not present

## 2018-12-21 LAB — BLOOD GAS, ARTERIAL
Acid-Base Excess: 1.3 mmol/L (ref 0.0–2.0)
Bicarbonate: 28.2 mmol/L — ABNORMAL HIGH (ref 20.0–28.0)
FIO2: 80
MECHVT: 450 mL
O2 Saturation: 88.9 %
PEEP: 8 cmH2O
Patient temperature: 98.6
RATE: 18 resp/min
pCO2 arterial: 70.6 mmHg (ref 32.0–48.0)
pH, Arterial: 7.226 — ABNORMAL LOW (ref 7.350–7.450)
pO2, Arterial: 61.2 mmHg — ABNORMAL LOW (ref 83.0–108.0)

## 2018-12-21 LAB — CBC
HCT: 26.5 % — ABNORMAL LOW (ref 39.0–52.0)
Hemoglobin: 7.8 g/dL — ABNORMAL LOW (ref 13.0–17.0)
MCH: 29.5 pg (ref 26.0–34.0)
MCHC: 29.4 g/dL — ABNORMAL LOW (ref 30.0–36.0)
MCV: 100.4 fL — ABNORMAL HIGH (ref 80.0–100.0)
Platelets: 319 10*3/uL (ref 150–400)
RBC: 2.64 MIL/uL — ABNORMAL LOW (ref 4.22–5.81)
RDW: 16.1 % — ABNORMAL HIGH (ref 11.5–15.5)
WBC: 8.1 10*3/uL (ref 4.0–10.5)
nRBC: 0 % (ref 0.0–0.2)

## 2018-12-21 NOTE — Progress Notes (Addendum)
[facility]ulmonary Critical [facility]   PULMONARY CRITICAL CARE SERVICE  PROGRESS NOTE  Date of Service: 12/21/2018  Benjamin [name]  ZOX:[id number]  DOB: June 27, 1956   DOA: 12/14/2018  Referring Physician: Carron CurieAli Hijazi, MD  HPI: Benjamin [name] is a 63 y.o. male seen for follow up of Acute on Chronic Respiratory Failure.  Patient remains on full support on the ventilator currently rate of 18 and FiO2 of 70%.  Resting comfortably with saturations in the mid 90s.  Medications: Reviewed on Rounds  Physical Exam:  Vitals: Pulse 76 respirations 18 BP 111/64 O2 sat 95% temp 97.4  Ventilator Settings ventilator mode AC VC rate of 18 tidal volume 450 PEEP of 7 FiO2 of 70%  . General: Comfortable at this time . Eyes: Grossly normal lids, irises & conjunctiva . ENT: grossly tongue is normal . Neck: no obvious mass . Cardiovascular: S1 S2 normal no gallop . Respiratory: Coarse breath sounds . Abdomen: soft . Skin: no rash seen on limited exam . Musculoskeletal: not rigid . Psychiatric:unable to assess . Neurologic: no seizure no involuntary movements         Lab Data:   Basic Metabolic Panel: Recent Labs  Lab 12/15/18 0710 12/17/18 0339 12/18/18 0349 12/20/18 0652  NA 152* 141 141 146*  K 3.4* 3.8 4.0 3.9  CL 118* 112* 109 110  CO2 23 23 24 28   GLUCOSE 122* 114* 107* 108*  BUN 53* 45* 42* 40*  CREATININE 0.92 1.07 1.11 0.94  CALCIUM 7.7* 7.6* 7.8* 7.9*    ABG: No results for input(s): PHART, PCO2ART, PO2ART, HCO3, O2SAT in the last 168 hours.  Liver Function Tests: Recent Labs  Lab 12/15/18 0710  AST 24  ALT 57*  ALKPHOS 91  BILITOT 0.2*  PROT 4.6*  ALBUMIN 1.8*   No results for input(s): LIPASE, AMYLASE in the last 168 hours. No results for input(s): AMMONIA in the last 168 hours.  CBC: Recent Labs  Lab 12/15/18 0710 12/21/18 0541  WBC 5.9 8.1  HGB 8.0* 7.8*  HCT 27.4* 26.5*  MCV 103.0* 100.4*  PLT 224 319    Cardiac  Enzymes: No results for input(s): CKTOTAL, CKMB, CKMBINDEX, TROPONINI in the last 168 hours.  BNP (last 3 results) No results for input(s): BNP in the last 8760 hours.  ProBNP (last 3 results) No results for input(s): PROBNP in the last 8760 hours.  Radiological Exams: Ct Chest Wo Contrast  Result Date: 12/20/2018 CLINICAL DATA:  63 year old male with acute on chronic respiratory failure and hypoxia. Interstitial lung disease. EXAM: CT CHEST WITHOUT CONTRAST TECHNIQUE: Multidetector CT imaging of the chest was performed following the standard protocol without IV contrast. COMPARISON:  Chest radiograph dated 12/19/2018 and CT of the abdomen pelvis dated 12/15/2018 FINDINGS: Evaluation of this exam is limited in the absence of intravenous contrast. Cardiovascular: There is no cardiomegaly or pericardial effusion. There is hypoattenuation of the cardiac blood pool suggestive of a degree of anemia. Clinical correlation is recommended. The thoracic aorta and central pulmonary arteries are grossly unremarkable on this noncontrast CT. Mediastinum/Nodes: No definite hilar or mediastinal adenopathy evaluation however is limited in the absence of contrast. An enteric tube is noted within the esophagus extending into the stomach. No mediastinal fluid collection. Lungs/Pleura: Tracheostomy above the carina. There is emphysematous changes of the lungs. There are scattered nodular densities measuring up to 20 mm in the left upper lobe which are indeterminate. These may be infectious or inflammatory in etiology or  represent a neoplastic or metastatic disease. Clinical correlation and close follow-up to resolution recommended. There is mucous impaction of the distal left mainstem bronchus and left lower lobe bronchi with complete left lower lobe consolidation/collapse. There are small bilateral pleural effusions with associated partial compressive atelectasis of the right lower lobe. Pneumonia is not excluded. There is  no pneumothorax. Upper Abdomen: Evaluation of the upper abdomen is limited due to streak artifact caused by patient's arms. No acute pathology identified. Musculoskeletal: There is cachexia and diffuse subcutaneous edema and anasarca. No acute osseous pathology. IMPRESSION: 1. Mucous impaction of the distal left mainstem bronchus and left lower lobe bronchi with complete left lower lobe consolidation/collapse. 2. Multiple bilateral pulmonary nodules may be infectious or inflammatory in etiology or represent metastatic disease. Clinical correlation and close follow-up recommended. 3. Small bilateral pleural effusions with partial compressive atelectasis of the right lower lobe. Pneumonia is not excluded. 4. Emphysema. Electronically Signed   By: Elgie Collard M.D.   On: 12/20/2018 23:57   Dg Chest Port 1 View  Result Date: 12/19/2018 CLINICAL DATA:  Respiratory distress EXAM: PORTABLE CHEST 1 VIEW COMPARISON:  12/14/2018 FINDINGS: The frontal projection was obtained on 3 images due to patient body habitus. Tracheostomy tube projects over the tracheal air column. A feeding tube coils in the upper abdomen that is probably in the fourth portion of the duodenum based on its curvature. Blunting of the costophrenic angles with hazy opacities at the lung bases compatible with layering pleural effusions. Left lateral rib irregularities compatible old rib fractures. Mild interstitial accentuation at the lung bases. IMPRESSION: 1. Bilateral layering pleural effusions with associated lower lobe atelectasis. 2. Faint interstitial accentuation at the lung bases. 3. Heart size within normal limits. Electronically Signed   By: Gaylyn Rong M.D.   On: 12/19/2018 13:40    Assessment/Plan Active Problems:   Acute on chronic respiratory failure with hypoxia (HCC)   Acute renal failure due to tubular necrosis (HCC)   Strangulated inguinal hernia   Necrotizing pneumonia (HCC)   Paroxysmal atrial fibrillation with  rapid ventricular response (HCC)   ARDS (adult respiratory distress syndrome) (HCC)   Severe sepsis (HCC)   1. Acute on chronic respiratory failure with hypoxia continue to wean per protocol.  Continue aggressive pulmonary toilet and secretion management 2. Acute tubular necrosis with kidney injury continue to follow labs 3. Strangulated inguinal hernia status post repair 4. Necrotizing pneumonia treated continue to follow 5. Atrial fibrillation with RVR continue current therapy 6. ARDS on ventilator currently requiring 70% FiO2.   I have personally seen and evaluated the patient, evaluated laboratory and imaging results, formulated the assessment and plan and placed orders. The Patient requires high complexity decision making for assessment and support.  Case was discussed on Rounds with the Respiratory Therapy Staff  Yevonne Pax, MD [facility] Pulmonary Critical Care Medicine Sleep Medicine

## 2018-12-22 DIAGNOSIS — J80 Acute respiratory distress syndrome: Secondary | ICD-10-CM | POA: Diagnosis not present

## 2018-12-22 DIAGNOSIS — J9811 Atelectasis: Secondary | ICD-10-CM | POA: Diagnosis not present

## 2018-12-22 DIAGNOSIS — N170 Acute kidney failure with tubular necrosis: Secondary | ICD-10-CM | POA: Diagnosis not present

## 2018-12-22 DIAGNOSIS — J850 Gangrene and necrosis of lung: Secondary | ICD-10-CM | POA: Diagnosis not present

## 2018-12-22 DIAGNOSIS — J9621 Acute and chronic respiratory failure with hypoxia: Secondary | ICD-10-CM | POA: Diagnosis not present

## 2018-12-22 LAB — BASIC METABOLIC PANEL
Anion gap: 8 (ref 5–15)
BUN: 44 mg/dL — ABNORMAL HIGH (ref 8–23)
CO2: 28 mmol/L (ref 22–32)
Calcium: 8.3 mg/dL — ABNORMAL LOW (ref 8.9–10.3)
Chloride: 115 mmol/L — ABNORMAL HIGH (ref 98–111)
Creatinine, Ser: 0.96 mg/dL (ref 0.61–1.24)
GFR calc Af Amer: >60 mL/min (ref >60)
GFR calc non Af Amer: >60 mL/min (ref >60)
Glucose, Bld: 99 mg/dL (ref 70–99)
Potassium: 3.8 mmol/L (ref 3.5–5.1)
Sodium: 151 mmol/L — ABNORMAL HIGH (ref 135–145)

## 2018-12-22 NOTE — Procedures (Signed)
Date: 12/22/2018,  MRN# [id number]    Procedure Note: Fiberoptic Bronchoscopy   PROCEDURE DATE: 12/22/2018     NAME:  Benjamin [name]   DOB:[birth year]   MRN: [id number] LOC:  [id number]      Indications/Preliminary Diagnosis: Atelectasis left lower lobe  Consent: (Place X beside choice/s below)  The benefits, risks and possible complications of the procedure were        explained to:  ___ patient  _X__ patient's family  ___ other:___________  who verbalized understanding and gave:  ___ verbal  ___ written  ___ verbal and written  _X__ telephone  ___ other:________ consent.      Unable to obtain consent; procedure performed on emergent basis.     Other:      PRESEDATION ASSESSMENT: History and Physical has been performed. Patient meds and allergies have been reviewed. Presedation airway examination has been performed and documented. Baseline vital signs, sedation score, oxygenation status, and cardiac rhythm were reviewed. Patient was deemed to be in satisfactory condition to undergo the procedure.  PREMEDICATIONS:   Sedative/Narcotic Amt Dose   Versed 2 mg   Fentanyl 50 mcg  Diprivan  mg         Insertion Route (Place X beside choice below)   Nasal   Oral   Endotracheal Tube  X Tracheostomy   INTRAPROCEDURE MEDICATIONS:  Sedative/Narcotic Amt Dose   Versed  mg   Fentanyl  mcg  Diprivan  mg       Medication Amt Dose  Medication Amt Dose  Xylocaine 2% 4 cc  Epinephrine 1:10,000 sol  cc  Xylocaine 4%  cc  Cocaine  cc   TECHNICAL PROCEDURES: (Place X beside choice below)   Procedures  Description  X  None     Electrocautery     Cryotherapy     Balloon Dilatation     Bronchography     Stent Placement     Therapeutic Aspiration     Laser/Argon Plasma            SPECIMENS (Sites): (Place X beside choice below)  Specimens Description   No Specimens Obtained     Washings   X Lavage  left lower lobe and lingula   Biopsies    Fine Needle Aspirates    Brushings    Sputum    FINDINGS:  ESTIMATED BLOOD LOSS: none  COMPLICATIONS/RESOLUTION: none  PROCEDURE DETAILS: Timeout performed and correct patient, name, & ID confirmed. Following prep per Pulmonary policy, appropriate sedation was administered.  Airway exam proceeded with findings, technical procedures, and specimen collection as noted below. At the end of exam the scope was withdrawn without incident. Impression and Plan as noted below.    Procedure Note: After patient was prepared in usual manner patient was given 2 mg of Versed and 50 mcg of fentanyl.  The fiberoptic scope was inserted through the tracheostomy tube down to the carina.  Carina was found to be nice and sharp.  The right lung was evaluated there was no significant mucous plugs or secretions noted.  This was on the right side.  Scope was withdrawn and then the left lung was evaluated.  Left lung showed white pearly looking mucus which was obstructing the left lower lobe bronchus.  The secretions were cleared by aggressive suctioning.  Bronchoalveolar lavage was performed 50 cc of normal saline was instilled and suctioned out specimen collected and sent to the lab for evaluation.  The underlying mucosa was found to be within normal  limits no erythema was noted.  Interestingly patient had significantly decreased sensation as there was no cough reflex being elicited with the fiberoptic scope and side the airways.  Patient tolerated the procedure well no bleeding no immediate complications    IMPRESSION:POST-PROCEDURE DX: Mucous plug left lower lobe cleared and bronchoalveolar lavage was performed   RECOMMENDATION/PLAN: Aggressive pulmonary toilet may benefit from chest PT  I have personally performed the procedure as noted above     Allyne Gee, MD [facility] Pulmonary Critical Care Medicine

## 2018-12-22 NOTE — Progress Notes (Addendum)
[facility]   PULMONARY CRITICAL CARE SERVICE  PROGRESS NOTE  Date of Service: 12/22/2018  Benjamin LedgerRick E Coopman  ZOX:[id number]  DOB: 1955/10/14   DOA: 12/14/2018  Referring Physician: Carron CurieAli Hijazi, MD  HPI: Benjamin [name] is a 63 y.o. male seen for follow up of Acute on Chronic Respiratory Failure.  Patient continues on full support on the ventilator now requiring a PEEP of 10 and FiO2 of 65%.  Scheduled for bronchoscopy today.  Medications: Reviewed on Rounds  Physical Exam:  Vitals: Pulse 80 respirations 24 BP 104/56 O2 sat 97% temp 96.7  Ventilator Settings ventilator mode AC VC rate of 18 tidal volume 450 PEEP of 10 FiO2 of 65%  . General: Comfortable at this time . Eyes: Grossly normal lids, irises & conjunctiva . ENT: grossly tongue is normal . Neck: no obvious mass . Cardiovascular: S1 S2 normal no gallop . Respiratory: Coarse breath sounds . Abdomen: soft . Skin: no rash seen on limited exam . Musculoskeletal: not rigid . Psychiatric:unable to assess . Neurologic: no seizure no involuntary movements         Lab Data:   Basic Metabolic Panel: Recent Labs  Lab 12/17/18 0339 12/18/18 0349 12/20/18 0652 12/22/18 0559  NA 141 141 146* 151*  K 3.8 4.0 3.9 3.8  CL 112* 109 110 115*  CO2 23 24 28 28   GLUCOSE 114* 107* 108* 99  BUN 45* 42* 40* 44*  CREATININE 1.07 1.11 0.94 0.96  CALCIUM 7.6* 7.8* 7.9* 8.3*    ABG: Recent Labs  Lab 12/21/18 1703  PHART 7.226*  PCO2ART 70.6*  PO2ART 61.2*  HCO3 28.2*  O2SAT 88.9    Liver Function Tests: No results for input(s): AST, ALT, ALKPHOS, BILITOT, PROT, ALBUMIN in the last 168 hours. No results for input(s): LIPASE, AMYLASE in the last 168 hours. No results for input(s): AMMONIA in the last 168 hours.  CBC: Recent Labs  Lab 12/21/18 0541  WBC 8.1  HGB 7.8*  HCT 26.5*  MCV 100.4*  PLT 319    Cardiac Enzymes: No results for input(s): CKTOTAL, CKMB,  CKMBINDEX, TROPONINI in the last 168 hours.  BNP (last 3 results) No results for input(s): BNP in the last 8760 hours.  ProBNP (last 3 results) No results for input(s): PROBNP in the last 8760 hours.  Radiological Exams: Ct Chest Wo Contrast  Result Date: 12/20/2018 CLINICAL DATA:  63 year old male with acute on chronic respiratory failure and hypoxia. Interstitial lung disease. EXAM: CT CHEST WITHOUT CONTRAST TECHNIQUE: Multidetector CT imaging of the chest was performed following the standard protocol without IV contrast. COMPARISON:  Chest radiograph dated 12/19/2018 and CT of the abdomen pelvis dated 12/15/2018 FINDINGS: Evaluation of this exam is limited in the absence of intravenous contrast. Cardiovascular: There is no cardiomegaly or pericardial effusion. There is hypoattenuation of the cardiac blood pool suggestive of a degree of anemia. Clinical correlation is recommended. The thoracic aorta and central pulmonary arteries are grossly unremarkable on this noncontrast CT. Mediastinum/Nodes: No definite hilar or mediastinal adenopathy evaluation however is limited in the absence of contrast. An enteric tube is noted within the esophagus extending into the stomach. No mediastinal fluid collection. Lungs/Pleura: Tracheostomy above the carina. There is emphysematous changes of the lungs. There are scattered nodular densities measuring up to 20 mm in the left upper lobe which are indeterminate. These may be infectious or inflammatory in etiology or represent a neoplastic or metastatic disease. Clinical correlation and close follow-up  to resolution recommended. There is mucous impaction of the distal left mainstem bronchus and left lower lobe bronchi with complete left lower lobe consolidation/collapse. There are small bilateral pleural effusions with associated partial compressive atelectasis of the right lower lobe. Pneumonia is not excluded. There is no pneumothorax. Upper Abdomen: Evaluation of the  upper abdomen is limited due to streak artifact caused by patient's arms. No acute pathology identified. Musculoskeletal: There is cachexia and diffuse subcutaneous edema and anasarca. No acute osseous pathology. IMPRESSION: 1. Mucous impaction of the distal left mainstem bronchus and left lower lobe bronchi with complete left lower lobe consolidation/collapse. 2. Multiple bilateral pulmonary nodules may be infectious or inflammatory in etiology or represent metastatic disease. Clinical correlation and close follow-up recommended. 3. Small bilateral pleural effusions with partial compressive atelectasis of the right lower lobe. Pneumonia is not excluded. 4. Emphysema. Electronically Signed   By: Elgie Collard M.D.   On: 12/20/2018 23:57    Assessment/Plan Active Problems:   Acute on chronic respiratory failure with hypoxia (HCC)   Acute renal failure due to tubular necrosis (HCC)   Strangulated inguinal hernia   Necrotizing pneumonia (HCC)   Paroxysmal atrial fibrillation with rapid ventricular response (HCC)   ARDS (adult respiratory distress syndrome) (HCC)   Severe sepsis (HCC)   1. Acute on chronic respiratory failure with hypoxia continue to wean per protocol.  Bronchoscopy today.  Continue aggressive pulmonary toilet and secretion management 2. Acute tubular necrosis with kidney injury continue to follow labs 3. Strangulated inguinal hernia status post repair 4. Necrotizing pneumonia treated continue to follow 5. Atrial fibrillation with RVR continue current therapy 6. ARDS on ventilator currently requiring 65% FiO2.   I have personally seen and evaluated the patient, evaluated laboratory and imaging results, formulated the assessment and plan and placed orders. The Patient requires high complexity decision making for assessment and support.  Case was discussed on Rounds with the Respiratory Therapy Staff  Yevonne Pax, MD [facility] Pulmonary Critical Care Medicine Sleep Medicine

## 2018-12-23 ENCOUNTER — Other Ambulatory Visit (HOSPITAL_COMMUNITY): Payer: Self-pay

## 2018-12-23 DIAGNOSIS — J850 Gangrene and necrosis of lung: Secondary | ICD-10-CM | POA: Diagnosis not present

## 2018-12-23 DIAGNOSIS — J9621 Acute and chronic respiratory failure with hypoxia: Secondary | ICD-10-CM | POA: Diagnosis not present

## 2018-12-23 DIAGNOSIS — J80 Acute respiratory distress syndrome: Secondary | ICD-10-CM | POA: Diagnosis not present

## 2018-12-23 DIAGNOSIS — N170 Acute kidney failure with tubular necrosis: Secondary | ICD-10-CM | POA: Diagnosis not present

## 2018-12-23 LAB — BLOOD GAS, ARTERIAL
Acid-Base Excess: 2.2 mmol/L — ABNORMAL HIGH (ref 0.0–2.0)
Bicarbonate: 26.3 mmol/L (ref 20.0–28.0)
FIO2: 55
MECHVT: 450 mL
O2 Saturation: 98.6 %
PEEP: 10 cmH2O
Patient temperature: 98.6
RATE: 26 resp/min
pCO2 arterial: 42.2 mmHg (ref 32.0–48.0)
pH, Arterial: 7.412 (ref 7.350–7.450)
pO2, Arterial: 120 mmHg — ABNORMAL HIGH (ref 83.0–108.0)

## 2018-12-23 NOTE — Progress Notes (Signed)
Pulmonary Critical Care Medicine [facility] [place]   PULMONARY CRITICAL CARE SERVICE  PROGRESS NOTE  Date of Service: 12/23/2018  LEGACY RAFFETTO  FMB:[id number]  DOB: [birth year]   DOA: 12/14/2018  Referring Physician: Carron Curie, MD  HPI: TYREZ SEVER is a 63 y.o. male seen for follow up of Acute on Chronic Respiratory Failure.  Currently patient is on full support on assist control mode has been on 55% FiO2 good saturations are noted  Medications: Reviewed on Rounds  Physical Exam:  Vitals: Temperature 97.1 pulse 84 respiratory rate 20 blood pressure 109/57 saturations 99%  Ventilator Settings mode ventilation is assist control FiO2 55% tidal volume 604 PEEP 10  . General: Comfortable at this time . Eyes: Grossly normal lids, irises & conjunctiva . ENT: grossly tongue is normal . Neck: no obvious mass . Cardiovascular: S1 S2 normal no gallop . Respiratory: No rhonchi no rales are noted at this time . Abdomen: soft . Skin: no rash seen on limited exam . Musculoskeletal: not rigid . Psychiatric:unable to assess . Neurologic: no seizure no involuntary movements         Lab Data:   Basic Metabolic Panel: Recent Labs  Lab 12/17/18 0339 12/18/18 0349 12/20/18 0652 12/22/18 0559  NA 141 141 146* 151*  K 3.8 4.0 3.9 3.8  CL 112* 109 110 115*  CO2 23 24 28 28   GLUCOSE 114* 107* 108* 99  BUN 45* 42* 40* 44*  CREATININE 1.07 1.11 0.94 0.96  CALCIUM 7.6* 7.8* 7.9* 8.3*    ABG: Recent Labs  Lab 12/21/18 1703  PHART 7.226*  PCO2ART 70.6*  PO2ART 61.2*  HCO3 28.2*  O2SAT 88.9    Liver Function Tests: No results for input(s): AST, ALT, ALKPHOS, BILITOT, PROT, ALBUMIN in the last 168 hours. No results for input(s): LIPASE, AMYLASE in the last 168 hours. No results for input(s): AMMONIA in the last 168 hours.  CBC: Recent Labs  Lab 12/21/18 0541  WBC 8.1  HGB 7.8*  HCT 26.5*  MCV 100.4*  PLT 319    Cardiac Enzymes: No results for  input(s): CKTOTAL, CKMB, CKMBINDEX, TROPONINI in the last 168 hours.  BNP (last 3 results) No results for input(s): BNP in the last 8760 hours.  ProBNP (last 3 results) No results for input(s): PROBNP in the last 8760 hours.  Radiological Exams: No results found.  Assessment/Plan Active Problems:   Acute on chronic respiratory failure with hypoxia (HCC)   Acute renal failure due to tubular necrosis (HCC)   Strangulated inguinal hernia   Necrotizing pneumonia (HCC)   Paroxysmal atrial fibrillation with rapid ventricular response (HCC)   ARDS (adult respiratory distress syndrome) (HCC)   Severe sepsis (HCC)   1. Acute on chronic respiratory failure with hypoxia we will continue with the try to titrate the oxygen down as tolerated. 2. Acute renal failure due to tubular necrosis we will continue to follow labs. 3. Strangulated inguinal hernia at baseline we will continue with present management 4. Necrotizing pneumonia treated we will continue to follow 5. Paroxysmal atrial fibrillation rate controlled 6. ARDS follow-up radiologically 7. Severe sepsis hemodynamically stable   I have personally seen and evaluated the patient, evaluated laboratory and imaging results, formulated the assessment and plan and placed orders. The Patient requires high complexity decision making for assessment and support.  Case was discussed on Rounds with the Respiratory Therapy Staff  Yevonne Pax, MD [facility] Pulmonary Critical Care Medicine Sleep Medicine

## 2018-12-24 DIAGNOSIS — J80 Acute respiratory distress syndrome: Secondary | ICD-10-CM | POA: Diagnosis not present

## 2018-12-24 DIAGNOSIS — J9621 Acute and chronic respiratory failure with hypoxia: Secondary | ICD-10-CM | POA: Diagnosis not present

## 2018-12-24 DIAGNOSIS — J850 Gangrene and necrosis of lung: Secondary | ICD-10-CM | POA: Diagnosis not present

## 2018-12-24 DIAGNOSIS — N170 Acute kidney failure with tubular necrosis: Secondary | ICD-10-CM | POA: Diagnosis not present

## 2018-12-24 LAB — SODIUM: Sodium: 154 mmol/L — ABNORMAL HIGH (ref 135–145)

## 2018-12-24 NOTE — Progress Notes (Addendum)
[facility] [place]   PULMONARY CRITICAL CARE SERVICE  PROGRESS NOTE  Date of Service: 12/24/2018  Benjamin LedgerRick E Angelo  ZOX:[id number]  DOB: 05/08/1956   DOA: 12/14/2018  Referring Physician: Carron CurieAli Hijazi, MD  HPI: Benjamin [name] is a 63 y.o. male seen for follow up of Acute on Chronic Respiratory Failure.  Patient remains on full support on the ventilator will continue to wean slowly per protocol.  Currently comfortable with no distress.  Medications: Reviewed on Rounds  Physical Exam:  Vitals: Pulse 103 respirations 23 BP 116/62 O2 sat 94% 98.4  Ventilator Settings ventilator mode AC VC rate 26 tidal volume 450 PEEP of 7 FiO2 50%  . General: Comfortable at this time . Eyes: Grossly normal lids, irises & conjunctiva . ENT: grossly tongue is normal . Neck: no obvious mass . Cardiovascular: S1 S2 normal no gallop . Respiratory: No rales or rhonchi noted . Abdomen: soft . Skin: no rash seen on limited exam . Musculoskeletal: not rigid . Psychiatric:unable to assess . Neurologic: no seizure no involuntary movements         Lab Data:   Basic Metabolic Panel: Recent Labs  Lab 12/18/18 0349 12/20/18 0652 12/22/18 0559 12/24/18 0658  NA 141 146* 151* 154*  K 4.0 3.9 3.8  --   CL 109 110 115*  --   CO2 24 28 28   --   GLUCOSE 107* 108* 99  --   BUN 42* 40* 44*  --   CREATININE 1.11 0.94 0.96  --   CALCIUM 7.8* 7.9* 8.3*  --     ABG: Recent Labs  Lab 12/21/18 1703 12/23/18 1640  PHART 7.226* 7.412  PCO2ART 70.6* 42.2  PO2ART 61.2* 120*  HCO3 28.2* 26.3  O2SAT 88.9 98.6    Liver Function Tests: No results for input(s): AST, ALT, ALKPHOS, BILITOT, PROT, ALBUMIN in the last 168 hours. No results for input(s): LIPASE, AMYLASE in the last 168 hours. No results for input(s): AMMONIA in the last 168 hours.  CBC: Recent Labs  Lab 12/21/18 0541  WBC 8.1  HGB 7.8*  HCT 26.5*  MCV 100.4*  PLT 319    Cardiac Enzymes: No  results for input(s): CKTOTAL, CKMB, CKMBINDEX, TROPONINI in the last 168 hours.  BNP (last 3 results) No results for input(s): BNP in the last 8760 hours.  ProBNP (last 3 results) No results for input(s): PROBNP in the last 8760 hours.  Radiological Exams: Dg Chest Port 1 View  Result Date: 12/23/2018 CLINICAL DATA:  Atelectasis, tracheostomy EXAM: PORTABLE CHEST 1 VIEW COMPARISON:  12/19/2018 chest radiograph. FINDINGS: Left rotated radiographs. Tracheostomy tube terminates over the tracheal air column just below thoracic inlet. Enteric tube enters the lower with the tip not seen on this image. Stable cardiomediastinal silhouette with normal heart size. No pneumothorax. Stable small bilateral pleural effusions, left greater than right. No overt pulmonary edema. Infrahilar right lung opacity is stable. Dense left lung base consolidation is stable. IMPRESSION: Stable chest radiograph with small bilateral pleural effusions, dense left retrocardiac consolidation and mild right infrahilar opacity compatible with pneumonia and/or atelectasis. Electronically Signed   By: Delbert PhenixJason A Poff M.D.   On: 12/23/2018 19:07    Assessment/Plan Active Problems:   Acute on chronic respiratory failure with hypoxia (HCC)   Acute renal failure due to tubular necrosis (HCC)   Strangulated inguinal hernia   Necrotizing pneumonia (HCC)   Paroxysmal atrial fibrillation with rapid ventricular response (HCC)   ARDS (adult respiratory  distress syndrome) (HCC)   Severe sepsis (HCC)   1. Acute on chronic respiratory failure with hypoxia continue titrate oxygen as tolerated.  Continue aggressive pulmonary toilet 2. Acute renal failure due to tubular necrosis continue to follow labs 3. Strangulated inguinal hernia at baseline continue present management 4. Necrotizing pneumonia treated continue follow-up 5. Paroxysmal atrial fibrillation rate controlled 6. ARDS follow-up radiologically 7. Severe sepsis hemodynamically  stable   I have personally seen and evaluated the patient, evaluated laboratory and imaging results, formulated the assessment and plan and placed orders. The Patient requires high complexity decision making for assessment and support.  Case was discussed on Rounds with the Respiratory Therapy Staff  Yevonne Pax, MD [facility] Pulmonary Critical Care Medicine Sleep Medicine

## 2018-12-25 DIAGNOSIS — N170 Acute kidney failure with tubular necrosis: Secondary | ICD-10-CM | POA: Diagnosis not present

## 2018-12-25 DIAGNOSIS — J80 Acute respiratory distress syndrome: Secondary | ICD-10-CM | POA: Diagnosis not present

## 2018-12-25 DIAGNOSIS — J9621 Acute and chronic respiratory failure with hypoxia: Secondary | ICD-10-CM | POA: Diagnosis not present

## 2018-12-25 DIAGNOSIS — J850 Gangrene and necrosis of lung: Secondary | ICD-10-CM | POA: Diagnosis not present

## 2018-12-25 NOTE — Progress Notes (Addendum)
Pulmonary Critical Care Medicine [facility] [place]   PULMONARY CRITICAL CARE SERVICE  PROGRESS NOTE  Date of Service: 12/25/2018  Benjamin [name]  BTD:[id number]  DOB: December 09, 1955   DOA: 12/14/2018  Referring Physician: Carron CurieAli Hijazi, MD  HPI: Benjamin [name] is a 63 y.o. male seen for follow up of Acute on Chronic Respiratory Failure.  Patient continues on full support on the ventilator with a rate of 26 and FiO2 of 50% currently.  Satting well with no distress.  Medications: Reviewed on Rounds  Physical Exam:  Vitals: Pulse 86 respirations 24 BP 130/66 O2 sat 96% temp 97.3  Ventilator Settings ventilator mode AC VC rate of 26 tidal volume 450 PEEP of 7 FiO2 of 50%  . General: Comfortable at this time . Eyes: Grossly normal lids, irises & conjunctiva . ENT: grossly tongue is normal . Neck: no obvious mass . Cardiovascular: S1 S2 normal no gallop . Respiratory: No rales or rhonchi noted . Abdomen: soft . Skin: no rash seen on limited exam . Musculoskeletal: not rigid . Psychiatric:unable to assess . Neurologic: no seizure no involuntary movements         Lab Data:   Basic Metabolic Panel: Recent Labs  Lab 12/20/18 0652 12/22/18 0559 12/24/18 0658  NA 146* 151* 154*  K 3.9 3.8  --   CL 110 115*  --   CO2 28 28  --   GLUCOSE 108* 99  --   BUN 40* 44*  --   CREATININE 0.94 0.96  --   CALCIUM 7.9* 8.3*  --     ABG: Recent Labs  Lab 12/21/18 1703 12/23/18 1640  PHART 7.226* 7.412  PCO2ART 70.6* 42.2  PO2ART 61.2* 120*  HCO3 28.2* 26.3  O2SAT 88.9 98.6    Liver Function Tests: No results for input(s): AST, ALT, ALKPHOS, BILITOT, PROT, ALBUMIN in the last 168 hours. No results for input(s): LIPASE, AMYLASE in the last 168 hours. No results for input(s): AMMONIA in the last 168 hours.  CBC: Recent Labs  Lab 12/21/18 0541  WBC 8.1  HGB 7.8*  HCT 26.5*  MCV 100.4*  PLT 319    Cardiac Enzymes: No results for input(s): CKTOTAL, CKMB,  CKMBINDEX, TROPONINI in the last 168 hours.  BNP (last 3 results) No results for input(s): BNP in the last 8760 hours.  ProBNP (last 3 results) No results for input(s): PROBNP in the last 8760 hours.  Radiological Exams: Dg Chest Port 1 View  Result Date: 12/23/2018 CLINICAL DATA:  Atelectasis, tracheostomy EXAM: PORTABLE CHEST 1 VIEW COMPARISON:  12/19/2018 chest radiograph. FINDINGS: Left rotated radiographs. Tracheostomy tube terminates over the tracheal air column just below thoracic inlet. Enteric tube enters the lower with the tip not seen on this image. Stable cardiomediastinal silhouette with normal heart size. No pneumothorax. Stable small bilateral pleural effusions, left greater than right. No overt pulmonary edema. Infrahilar right lung opacity is stable. Dense left lung base consolidation is stable. IMPRESSION: Stable chest radiograph with small bilateral pleural effusions, dense left retrocardiac consolidation and mild right infrahilar opacity compatible with pneumonia and/or atelectasis. Electronically Signed   By: Delbert PhenixJason A Poff M.D.   On: 12/23/2018 19:07    Assessment/Plan Active Problems:   Acute on chronic respiratory failure with hypoxia (HCC)   Acute renal failure due to tubular necrosis (HCC)   Strangulated inguinal hernia   Necrotizing pneumonia (HCC)   Paroxysmal atrial fibrillation with rapid ventricular response (HCC)   ARDS (adult respiratory distress syndrome) (HCC)  Severe sepsis (HCC)   1. Acute on respiratory failure with hypoxia continue titrate oxygen as tolerated.  Continue supportive measures and pulmonary toilet at this time. 2. Acute renal failure due to lupus continue to follow labs 3. Strangulated inguinal hernia 4. Continue present management 5. Necrotizing pneumonia treated continue follow-up 6. Paradoxical atrial fibrillation rate controlled 7. ARDS follow-up radiologically 8. Severe sepsis hemodynamically stable   I have personally seen  and evaluated the patient, evaluated laboratory and imaging results, formulated the assessment and plan and placed orders. The Patient requires high complexity decision making for assessment and support.  Case was discussed on Rounds with the Respiratory Therapy Staff  Yevonne Pax, MD [facility] Pulmonary Critical Care Medicine Sleep Medicine

## 2018-12-26 DIAGNOSIS — J80 Acute respiratory distress syndrome: Secondary | ICD-10-CM | POA: Diagnosis not present

## 2018-12-26 DIAGNOSIS — J850 Gangrene and necrosis of lung: Secondary | ICD-10-CM | POA: Diagnosis not present

## 2018-12-26 DIAGNOSIS — N170 Acute kidney failure with tubular necrosis: Secondary | ICD-10-CM | POA: Diagnosis not present

## 2018-12-26 DIAGNOSIS — J9621 Acute and chronic respiratory failure with hypoxia: Secondary | ICD-10-CM | POA: Diagnosis not present

## 2018-12-26 LAB — BASIC METABOLIC PANEL
Anion gap: 10 (ref 5–15)
BUN: 52 mg/dL — ABNORMAL HIGH (ref 8–23)
CO2: 27 mmol/L (ref 22–32)
Calcium: 8.2 mg/dL — ABNORMAL LOW (ref 8.9–10.3)
Chloride: 117 mmol/L — ABNORMAL HIGH (ref 98–111)
Creatinine, Ser: 0.82 mg/dL (ref 0.61–1.24)
GFR calc Af Amer: >60 mL/min (ref >60)
GFR calc non Af Amer: >60 mL/min (ref >60)
Glucose, Bld: 98 mg/dL (ref 70–99)
Potassium: 3.5 mmol/L (ref 3.5–5.1)
Sodium: 154 mmol/L — ABNORMAL HIGH (ref 135–145)

## 2018-12-26 NOTE — Progress Notes (Addendum)
Pulmonary Critical Care Medicine [facility] [place]   PULMONARY CRITICAL CARE SERVICE  PROGRESS NOTE  Date of Service: 12/26/2018  LON BERNER  FKC:[id number]  DOB: 02-20-1956   DOA: 12/14/2018  Referring Physician: Carron Curie, MD  HPI: Benjamin [name] is a 63 y.o. male seen for follow up of Acute on Chronic Respiratory Failure.  Patient continues to do well has a 2-hour goal today on pressure support 12/5 with an FiO2 of 50%.  Currently doing well with no distress.  Medications: Reviewed on Rounds  Physical Exam:  Vitals: Pulse 80 respirations 24 BP 08/23/1938 O2 sat 94% temp 96.8  Ventilator Settings pressure support 12/5 FiO2 50% FiO2 our goal will rest on ventilator mode AC VC rate 26 tidal volume 450 PEEP of 7 FiO2 50%  . General: Comfortable at this time . Eyes: Grossly normal lids, irises & conjunctiva . ENT: grossly tongue is normal . Neck: no obvious mass . Cardiovascular: S1 S2 normal no gallop . Respiratory: No rales or rhonchi noted . Abdomen: soft . Skin: no rash seen on limited exam . Musculoskeletal: not rigid . Psychiatric:unable to assess . Neurologic: no seizure no involuntary movements         Lab Data:   Basic Metabolic Panel: Recent Labs  Lab 12/20/18 0652 12/22/18 0559 12/24/18 0658 12/26/18 0659  NA 146* 151* 154* 154*  K 3.9 3.8  --  3.5  CL 110 115*  --  117*  CO2 28 28  --  27  GLUCOSE 108* 99  --  98  BUN 40* 44*  --  52*  CREATININE 0.94 0.96  --  0.82  CALCIUM 7.9* 8.3*  --  8.2*    ABG: Recent Labs  Lab 12/21/18 1703 12/23/18 1640  PHART 7.226* 7.412  PCO2ART 70.6* 42.2  PO2ART 61.2* 120*  HCO3 28.2* 26.3  O2SAT 88.9 98.6    Liver Function Tests: No results for input(s): AST, ALT, ALKPHOS, BILITOT, PROT, ALBUMIN in the last 168 hours. No results for input(s): LIPASE, AMYLASE in the last 168 hours. No results for input(s): AMMONIA in the last 168 hours.  CBC: Recent Labs  Lab 12/21/18 0541  WBC 8.1   HGB 7.8*  HCT 26.5*  MCV 100.4*  PLT 319    Cardiac Enzymes: No results for input(s): CKTOTAL, CKMB, CKMBINDEX, TROPONINI in the last 168 hours.  BNP (last 3 results) No results for input(s): BNP in the last 8760 hours.  ProBNP (last 3 results) No results for input(s): PROBNP in the last 8760 hours.  Radiological Exams: No results found.  Assessment/Plan Active Problems:   Acute on chronic respiratory failure with hypoxia (HCC)   Acute renal failure due to tubular necrosis (HCC)   Strangulated inguinal hernia   Necrotizing pneumonia (HCC)   Paroxysmal atrial fibrillation with rapid ventricular response (HCC)   ARDS (adult respiratory distress syndrome) (HCC)   Severe sepsis (HCC)   1. Acute on chronic respiratory failure with hypoxia continue to titrate oxygen as tolerated continue pulmonary toilet and supportive measures.  Goal 2 hours on pressure support today. 2. Acute renal failure due to tubular necrosis continue to follow 3. Strangulated inguinal hernia at baseline continue present management 4. Necrotizing pneumonia treated continue to follow 5. Paradoxical atrial fibrillation rate controlled 6. ARDS follow-up radiologically 7. Severe sepsis hemodynamically stable   I have personally seen and evaluated the patient, evaluated laboratory and imaging results, formulated the assessment and plan and placed orders. The Patient requires high  complexity decision making for assessment and support.  Case was discussed on Rounds with the Respiratory Therapy Staff  Allyne Gee, MD [facility] Pulmonary Critical Care Medicine Sleep Medicine

## 2018-12-27 ENCOUNTER — Other Ambulatory Visit (HOSPITAL_COMMUNITY): Payer: Self-pay

## 2018-12-27 DIAGNOSIS — J850 Gangrene and necrosis of lung: Secondary | ICD-10-CM | POA: Diagnosis not present

## 2018-12-27 DIAGNOSIS — J9621 Acute and chronic respiratory failure with hypoxia: Secondary | ICD-10-CM | POA: Diagnosis not present

## 2018-12-27 DIAGNOSIS — J80 Acute respiratory distress syndrome: Secondary | ICD-10-CM | POA: Diagnosis not present

## 2018-12-27 DIAGNOSIS — N170 Acute kidney failure with tubular necrosis: Secondary | ICD-10-CM | POA: Diagnosis not present

## 2018-12-27 NOTE — Progress Notes (Addendum)
Pulmonary Critical Care Medicine [facility] [place]   PULMONARY CRITICAL CARE SERVICE  PROGRESS NOTE  Date of Service: 12/27/2018  Benjamin [name]  [id number]  DOB: Oct 21, 1955   DOA: 12/14/2018  Referring Physician: Carron Curie, MD  HPI: Benjamin [name] is a 63 y.o. male seen for follow up of Acute on Chronic Respiratory Failure.  Patient is able tolerate 2 hours on pressure support yesterday today did very well at 4 hours and respiratory therapy is inpatient to 8 hours.  Patient is resting comfortably on pressure support 12/7 with an FiO2 of 50% satting well with no distress.  Medications: Reviewed on Rounds  Physical Exam:  Vitals: Pulse 75 respirations 26 BP 99/49 O2 sat 98% temp 97.2  Ventilator Settings pressure support 12/7 FiO2 50%  . General: Comfortable at this time . Eyes: Grossly normal lids, irises & conjunctiva . ENT: grossly tongue is normal . Neck: no obvious mass . Cardiovascular: S1 S2 normal no gallop . Respiratory: No rales or rhonchi noted . Abdomen: soft . Skin: no rash seen on limited exam . Musculoskeletal: not rigid . Psychiatric:unable to assess . Neurologic: no seizure no involuntary movements         Lab Data:   Basic Metabolic Panel: Recent Labs  Lab 12/22/18 0559 12/24/18 0658 12/26/18 0659  NA 151* 154* 154*  K 3.8  --  3.5  CL 115*  --  117*  CO2 28  --  27  GLUCOSE 99  --  98  BUN 44*  --  52*  CREATININE 0.96  --  0.82  CALCIUM 8.3*  --  8.2*    ABG: Recent Labs  Lab 12/21/18 1703 12/23/18 1640  PHART 7.226* 7.412  PCO2ART 70.6* 42.2  PO2ART 61.2* 120*  HCO3 28.2* 26.3  O2SAT 88.9 98.6    Liver Function Tests: No results for input(s): AST, ALT, ALKPHOS, BILITOT, PROT, ALBUMIN in the last 168 hours. No results for input(s): LIPASE, AMYLASE in the last 168 hours. No results for input(s): AMMONIA in the last 168 hours.  CBC: Recent Labs  Lab 12/21/18 0541  WBC 8.1  HGB 7.8*  HCT 26.5*  MCV 100.4*   PLT 319    Cardiac Enzymes: No results for input(s): CKTOTAL, CKMB, CKMBINDEX, TROPONINI in the last 168 hours.  BNP (last 3 results) No results for input(s): BNP in the last 8760 hours.  ProBNP (last 3 results) No results for input(s): PROBNP in the last 8760 hours.  Radiological Exams: No results found.  Assessment/Plan Active Problems:   Acute on chronic respiratory failure with hypoxia (HCC)   Acute renal failure due to tubular necrosis (HCC)   Strangulated inguinal hernia   Necrotizing pneumonia (HCC)   Paroxysmal atrial fibrillation with rapid ventricular response (HCC)   ARDS (adult respiratory distress syndrome) (HCC)   Severe sepsis (HCC)   1. Acute on chronic respiratory failure with hypoxia continue to do pressure support trials and titrate oxygen as tolerated.  Goal of 4 to 8 hours on pressure support today.  Continue high toilet and secretion management 2. Acute renal failure due to tubular necrosis continue to follow 3. Strangulated inguinal hernia at baseline continue present management 4. Necrotizing pneumonia treated continue to follow 5. Proximal atrial fibrillation rate controlled 6. ARDS follow-up radiologically 7. Severe sepsis hemodynamically stable   I have personally seen and evaluated the patient, evaluated laboratory and imaging results, formulated the assessment and plan and placed orders. The Patient requires high complexity decision making  for assessment and support.  Case was discussed on Rounds with the Respiratory Therapy Staff  Allyne Gee, MD [facility] Pulmonary Critical Care Medicine Sleep Medicine

## 2018-12-28 DIAGNOSIS — J850 Gangrene and necrosis of lung: Secondary | ICD-10-CM | POA: Diagnosis not present

## 2018-12-28 DIAGNOSIS — J9621 Acute and chronic respiratory failure with hypoxia: Secondary | ICD-10-CM | POA: Diagnosis not present

## 2018-12-28 DIAGNOSIS — N170 Acute kidney failure with tubular necrosis: Secondary | ICD-10-CM | POA: Diagnosis not present

## 2018-12-28 DIAGNOSIS — J80 Acute respiratory distress syndrome: Secondary | ICD-10-CM | POA: Diagnosis not present

## 2018-12-28 LAB — BASIC METABOLIC PANEL
Anion gap: 9 (ref 5–15)
BUN: 47 mg/dL — ABNORMAL HIGH (ref 8–23)
CO2: 27 mmol/L (ref 22–32)
Calcium: 8.3 mg/dL — ABNORMAL LOW (ref 8.9–10.3)
Chloride: 113 mmol/L — ABNORMAL HIGH (ref 98–111)
Creatinine, Ser: 0.78 mg/dL (ref 0.61–1.24)
GFR calc Af Amer: >60 mL/min (ref >60)
GFR calc non Af Amer: >60 mL/min (ref >60)
Glucose, Bld: 93 mg/dL (ref 70–99)
Potassium: 3.1 mmol/L — ABNORMAL LOW (ref 3.5–5.1)
Sodium: 149 mmol/L — ABNORMAL HIGH (ref 135–145)

## 2018-12-28 NOTE — Progress Notes (Addendum)
Pulmonary Critical Care Medicine [facility] [place]   PULMONARY CRITICAL CARE SERVICE  PROGRESS NOTE  Date of Service: 12/28/2018  Benjamin [name]  [id number]  DOB: Jun 05, 1956   DOA: 12/14/2018  Referring Physician: Carron Curie, MD  HPI: MARLO BOSSERT is a 63 y.o. male seen for follow up of Acute on Chronic Respiratory Failure.  Patient is on pressure support currently with a goal of 12 hours with an FiO2 of 50%.  Satting well with no distress.  Medications: Reviewed on Rounds  Physical Exam:  Vitals: Pulse 77 respirations 18 BP 112/55 O2 sat 96% temp 97.4  Ventilator Settings pressure support 12/5 FiO2 50%  . General: Comfortable at this time . Eyes: Grossly normal lids, irises & conjunctiva . ENT: grossly tongue is normal . Neck: no obvious mass . Cardiovascular: S1 S2 normal no gallop . Respiratory: No rales or rhonchi noted . Abdomen: soft . Skin: no rash seen on limited exam . Musculoskeletal: not rigid . Psychiatric:unable to assess . Neurologic: no seizure no involuntary movements         Lab Data:   Basic Metabolic Panel: Recent Labs  Lab 12/22/18 0559 12/24/18 0658 12/26/18 0659 12/28/18 0618  NA 151* 154* 154* 149*  K 3.8  --  3.5 3.1*  CL 115*  --  117* 113*  CO2 28  --  27 27  GLUCOSE 99  --  98 93  BUN 44*  --  52* 47*  CREATININE 0.96  --  0.82 0.78  CALCIUM 8.3*  --  8.2* 8.3*    ABG: Recent Labs  Lab 12/21/18 1703 12/23/18 1640  PHART 7.226* 7.412  PCO2ART 70.6* 42.2  PO2ART 61.2* 120*  HCO3 28.2* 26.3  O2SAT 88.9 98.6    Liver Function Tests: No results for input(s): AST, ALT, ALKPHOS, BILITOT, PROT, ALBUMIN in the last 168 hours. No results for input(s): LIPASE, AMYLASE in the last 168 hours. No results for input(s): AMMONIA in the last 168 hours.  CBC: No results for input(s): WBC, NEUTROABS, HGB, HCT, MCV, PLT in the last 168 hours.  Cardiac Enzymes: No results for input(s): CKTOTAL, CKMB, CKMBINDEX,  TROPONINI in the last 168 hours.  BNP (last 3 results) No results for input(s): BNP in the last 8760 hours.  ProBNP (last 3 results) No results for input(s): PROBNP in the last 8760 hours.  Radiological Exams: Dg Abd Portable 1v  Result Date: 12/27/2018 CLINICAL DATA:  63 year old male with impaired enteric tube. EXAM: PORTABLE ABDOMEN - 1 VIEW COMPARISON:  CT Abdomen and Pelvis 12/15/2018. FINDINGS: Portable AP view at 2016 hours. Enteric feeding tube is in place in the left upper quadrant with tip at the level of the proximal gastric body. Paucity of bowel gas in the visible abdomen. Continued veiling opacity at the lung bases suggesting pleural effusion. No acute osseous abnormality identified. IMPRESSION: 1. Enteric feeding tube placed, tip at the level of the proximal gastric body. Advance 10-15 cm to allow enough slack if post pyloric transit is desired. 2. Paucity of bowel gas.  Continued pleural effusions. Electronically Signed   By: Odessa Fleming M.D.   On: 12/27/2018 20:39    Assessment/Plan Active Problems:   Acute on chronic respiratory failure with hypoxia (HCC)   Acute renal failure due to tubular necrosis (HCC)   Strangulated inguinal hernia   Necrotizing pneumonia (HCC)   Paroxysmal atrial fibrillation with rapid ventricular response (HCC)   ARDS (adult respiratory distress syndrome) (HCC)   Severe sepsis (  HCC)   1. Acute on chronic respiratory failure with hypoxia continue pressure support trials with a goal of 12 hours today.  Continue to titrate as per protocol.  Continue pulmonary toilet and secretion management 2. Acute renal failure due to tubular necrosis continue to follow strangulated inguinal hernia at baseline continue present management 3. Necrotizing pneumonia treated continue to follow 4. Paradoxical atrial fibrillation rate controlled 5. ARDS followed up radiologically 6. Severe sepsis hemodynamically stable   I have personally seen and evaluated the patient,  evaluated laboratory and imaging results, formulated the assessment and plan and placed orders. The Patient requires high complexity decision making for assessment and support.  Case was discussed on Rounds with the Respiratory Therapy Staff  Yevonne PaxSaadat A Pope Brunty, MD [facility] Pulmonary Critical Care Medicine Sleep Medicine

## 2018-12-29 DIAGNOSIS — J80 Acute respiratory distress syndrome: Secondary | ICD-10-CM | POA: Diagnosis not present

## 2018-12-29 DIAGNOSIS — J9621 Acute and chronic respiratory failure with hypoxia: Secondary | ICD-10-CM | POA: Diagnosis not present

## 2018-12-29 DIAGNOSIS — N170 Acute kidney failure with tubular necrosis: Secondary | ICD-10-CM | POA: Diagnosis not present

## 2018-12-29 DIAGNOSIS — J850 Gangrene and necrosis of lung: Secondary | ICD-10-CM | POA: Diagnosis not present

## 2018-12-29 LAB — POTASSIUM: Potassium: 3.9 mmol/L (ref 3.5–5.1)

## 2018-12-29 NOTE — Progress Notes (Addendum)
[facility] [place]   PULMONARY CRITICAL CARE SERVICE  PROGRESS NOTE  Date of Service: 12/29/2018  Jillyn LedgerRick E Conly  ZOX:[id number]  DOB: 01/13/1956   DOA: 12/14/2018  Referring Physician: Carron CurieAli Hijazi, MD  HPI: Jillyn LedgerRick E Weintraub is a 63 y.o. male seen for follow up of Acute on Chronic Respiratory Failure.  Patient has a 16-hour goal today on pressure support 12/7 with an FiO2 of 50%.  Resting well with no distress at this time.  Medications: Reviewed on Rounds  Physical Exam:  Vitals: Pulse 79 respirations 24 BP 109/55 O2 sat 96% temp 97.0  Ventilator Settings pressure support 12/7 FiO2 50%  . General: Comfortable at this time . Eyes: Grossly normal lids, irises & conjunctiva . ENT: grossly tongue is normal . Neck: no obvious mass . Cardiovascular: S1 S2 normal no gallop . Respiratory: No rales or rhonchi noted . Abdomen: soft . Skin: no rash seen on limited exam . Musculoskeletal: not rigid . Psychiatric:unable to assess . Neurologic: no seizure no involuntary movements         Lab Data:   Basic Metabolic Panel: Recent Labs  Lab 12/24/18 0658 12/26/18 0659 12/28/18 0618 12/29/18 0536  NA 154* 154* 149*  --   K  --  3.5 3.1* 3.9  CL  --  117* 113*  --   CO2  --  27 27  --   GLUCOSE  --  98 93  --   BUN  --  52* 47*  --   CREATININE  --  0.82 0.78  --   CALCIUM  --  8.2* 8.3*  --     ABG: Recent Labs  Lab 12/23/18 1640  PHART 7.412  PCO2ART 42.2  PO2ART 120*  HCO3 26.3  O2SAT 98.6    Liver Function Tests: No results for input(s): AST, ALT, ALKPHOS, BILITOT, PROT, ALBUMIN in the last 168 hours. No results for input(s): LIPASE, AMYLASE in the last 168 hours. No results for input(s): AMMONIA in the last 168 hours.  CBC: No results for input(s): WBC, NEUTROABS, HGB, HCT, MCV, PLT in the last 168 hours.  Cardiac Enzymes: No results for input(s): CKTOTAL, CKMB, CKMBINDEX, TROPONINI in the last 168 hours.  BNP  (last 3 results) No results for input(s): BNP in the last 8760 hours.  ProBNP (last 3 results) No results for input(s): PROBNP in the last 8760 hours.  Radiological Exams: Dg Abd Portable 1v  Result Date: 12/27/2018 CLINICAL DATA:  63 year old male with impaired enteric tube. EXAM: PORTABLE ABDOMEN - 1 VIEW COMPARISON:  CT Abdomen and Pelvis 12/15/2018. FINDINGS: Portable AP view at 2016 hours. Enteric feeding tube is in place in the left upper quadrant with tip at the level of the proximal gastric body. Paucity of bowel gas in the visible abdomen. Continued veiling opacity at the lung bases suggesting pleural effusion. No acute osseous abnormality identified. IMPRESSION: 1. Enteric feeding tube placed, tip at the level of the proximal gastric body. Advance 10-15 cm to allow enough slack if post pyloric transit is desired. 2. Paucity of bowel gas.  Continued pleural effusions. Electronically Signed   By: Odessa FlemingH  Hall M.D.   On: 12/27/2018 20:39    Assessment/Plan Active Problems:   Acute on chronic respiratory failure with hypoxia (HCC)   Acute renal failure due to tubular necrosis (HCC)   Strangulated inguinal hernia   Necrotizing pneumonia (HCC)   Paroxysmal atrial fibrillation with rapid ventricular response (HCC)   ARDS (adult  respiratory distress syndrome) (HCC)   Severe sepsis (HCC)   1. Acute on chronic respiratory failure with hypoxia continue pressure support trial with a goal of 16 hours today.  Continue pulmonary toilet secretion management as well as supportive measures 2. Acute renal failure due to tubular necrosis continue to follow 3. Strangulated inguinal hernia at baseline continue present management 4. Necrotizing pneumonia treated continue to follow 5. Paradoxical atrial fibrillation rate controlled 6. ARDS follow-up radiologically 7. Severe sepsis hemodynamically stable   I have personally seen and evaluated the patient, evaluated laboratory and imaging results,  formulated the assessment and plan and placed orders. The Patient requires high complexity decision making for assessment and support.  Case was discussed on Rounds with the Respiratory Therapy Staff  Yevonne Pax, MD [facility] Pulmonary Critical Care Medicine Sleep Medicine

## 2018-12-30 DIAGNOSIS — J9621 Acute and chronic respiratory failure with hypoxia: Secondary | ICD-10-CM | POA: Diagnosis not present

## 2018-12-30 DIAGNOSIS — J80 Acute respiratory distress syndrome: Secondary | ICD-10-CM | POA: Diagnosis not present

## 2018-12-30 DIAGNOSIS — N170 Acute kidney failure with tubular necrosis: Secondary | ICD-10-CM | POA: Diagnosis not present

## 2018-12-30 DIAGNOSIS — J850 Gangrene and necrosis of lung: Secondary | ICD-10-CM | POA: Diagnosis not present

## 2018-12-30 NOTE — Progress Notes (Addendum)
Pulmonary Critical Care Medicine [facility] [place]   PULMONARY CRITICAL CARE SERVICE  PROGRESS NOTE  Date of Service: 12/30/2018  Benjamin [name]  WSF:[id number]  DOB: 1956/01/29   DOA: 12/14/2018  Referring Physician: Carron Curie, MD  HPI: Benjamin [name] is a 63 y.o. male seen for follow up of Acute on Chronic Respiratory Failure.  Patient has a goal today of 2 hours on aerosol trach collar 60% FiO2.  Currently doing well with no distress.  Medications: Reviewed on Rounds  Physical Exam:  Vitals: Pulse 83 respirations 28 BP 94/50 O2 sat 93% temp 97.3  Ventilator Settings ATC 60%  . General: Comfortable at this time . Eyes: Grossly normal lids, irises & conjunctiva . ENT: grossly tongue is normal . Neck: no obvious mass . Cardiovascular: S1 S2 normal no gallop . Respiratory: No rales or rhonchi noted . Abdomen: soft . Skin: no rash seen on limited exam . Musculoskeletal: not rigid . Psychiatric:unable to assess . Neurologic: no seizure no involuntary movements         Lab Data:   Basic Metabolic Panel: Recent Labs  Lab 12/24/18 0658 12/26/18 0659 12/28/18 0618 12/29/18 0536  NA 154* 154* 149*  --   K  --  3.5 3.1* 3.9  CL  --  117* 113*  --   CO2  --  27 27  --   GLUCOSE  --  98 93  --   BUN  --  52* 47*  --   CREATININE  --  0.82 0.78  --   CALCIUM  --  8.2* 8.3*  --     ABG: Recent Labs  Lab 12/23/18 1640  PHART 7.412  PCO2ART 42.2  PO2ART 120*  HCO3 26.3  O2SAT 98.6    Liver Function Tests: No results for input(s): AST, ALT, ALKPHOS, BILITOT, PROT, ALBUMIN in the last 168 hours. No results for input(s): LIPASE, AMYLASE in the last 168 hours. No results for input(s): AMMONIA in the last 168 hours.  CBC: No results for input(s): WBC, NEUTROABS, HGB, HCT, MCV, PLT in the last 168 hours.  Cardiac Enzymes: No results for input(s): CKTOTAL, CKMB, CKMBINDEX, TROPONINI in the last 168 hours.  BNP (last 3 results) No results for  input(s): BNP in the last 8760 hours.  ProBNP (last 3 results) No results for input(s): PROBNP in the last 8760 hours.  Radiological Exams: No results found.  Assessment/Plan Active Problems:   Acute on chronic respiratory failure with hypoxia (HCC)   Acute renal failure due to tubular necrosis (HCC)   Strangulated inguinal hernia   Necrotizing pneumonia (HCC)   Paroxysmal atrial fibrillation with rapid ventricular response (HCC)   ARDS (adult respiratory distress syndrome) (HCC)   Severe sepsis (HCC)   1. Acute on chronic respiratory failure with hypoxia continue aerosol trach collar trial with a goal of 2 hours today and then placed back on pressure support.  Continue pulmonary toilet secretion management 2. Acute renal failure due to tubular necrosis continue to follow 3. Strangulated inguinal hernia at baseline continue present therapy 4. Necrotizing pneumonia treated continue to follow 5. Paroxysmal atrial fibrillation rate controlled 6. ARDS follow-up radiologically 7. Severe sepsis hemodynamically stable   I have personally seen and evaluated the patient, evaluated laboratory and imaging results, formulated the assessment and plan and placed orders. The Patient requires high complexity decision making for assessment and support.  Case was discussed on Rounds with the Respiratory Therapy Staff  Yevonne Pax, MD [facility]  Pulmonary Critical Care Medicine Sleep Medicine

## 2018-12-31 ENCOUNTER — Other Ambulatory Visit (HOSPITAL_COMMUNITY): Payer: Self-pay

## 2018-12-31 DIAGNOSIS — J850 Gangrene and necrosis of lung: Secondary | ICD-10-CM | POA: Diagnosis not present

## 2018-12-31 DIAGNOSIS — J9621 Acute and chronic respiratory failure with hypoxia: Secondary | ICD-10-CM | POA: Diagnosis not present

## 2018-12-31 DIAGNOSIS — N170 Acute kidney failure with tubular necrosis: Secondary | ICD-10-CM | POA: Diagnosis not present

## 2018-12-31 DIAGNOSIS — J80 Acute respiratory distress syndrome: Secondary | ICD-10-CM | POA: Diagnosis not present

## 2018-12-31 LAB — BLOOD GAS, ARTERIAL
Acid-Base Excess: 2.8 mmol/L — ABNORMAL HIGH (ref 0.0–2.0)
Bicarbonate: 28.1 mmol/L — ABNORMAL HIGH (ref 20.0–28.0)
FIO2: 100
MECHVT: 450 mL
O2 Saturation: 96.6 %
PEEP: 7 cmH2O
Patient temperature: 98.6
RATE: 26 resp/min
pCO2 arterial: 53.8 mmHg — ABNORMAL HIGH (ref 32.0–48.0)
pH, Arterial: 7.338 — ABNORMAL LOW (ref 7.350–7.450)
pO2, Arterial: 88.4 mmHg (ref 83.0–108.0)

## 2018-12-31 NOTE — Progress Notes (Addendum)
[facility] [place]   PULMONARY CRITICAL CARE SERVICE  PROGRESS NOTE  Date of Service: 12/31/2018  Benjamin [name]  ZOX:[id number]  DOB: [birth year]   DOA: 12/14/2018  Referring Physician: Carron CurieAli Hijazi, MD  HPI: Benjamin [name] is a 63 y.o. male seen for follow up of Acute on Chronic Respiratory Failure.  Patient mains on full support on the ventilator at this time.  Satting well with no distress.  Medications: Reviewed on Rounds  Physical Exam:  Vitals: Pulse 89 respirations 26 BP 110/50 O2 sat 99% temp 97.1  Ventilator Settings ventilator mode AC VC rate of 26 tidal volume 450 PEEP of 10 FiO2 of 60%  . General: Comfortable at this time . Eyes: Grossly normal lids, irises & conjunctiva . ENT: grossly tongue is normal . Neck: no obvious mass . Cardiovascular: S1 S2 normal no gallop . Respiratory: No rales or rhonchi noted . Abdomen: soft . Skin: no rash seen on limited exam . Musculoskeletal: not rigid . Psychiatric:unable to assess . Neurologic: no seizure no involuntary movements         Lab Data:   Basic Metabolic Panel: Recent Labs  Lab 12/26/18 0659 12/28/18 0618 12/29/18 0536  NA 154* 149*  --   K 3.5 3.1* 3.9  CL 117* 113*  --   CO2 27 27  --   GLUCOSE 98 93  --   BUN 52* 47*  --   CREATININE 0.82 0.78  --   CALCIUM 8.2* 8.3*  --     ABG: Recent Labs  Lab 12/31/18 0250  PHART 7.338*  PCO2ART 53.8*  PO2ART 88.4  HCO3 28.1*  O2SAT 96.6    Liver Function Tests: No results for input(s): AST, ALT, ALKPHOS, BILITOT, PROT, ALBUMIN in the last 168 hours. No results for input(s): LIPASE, AMYLASE in the last 168 hours. No results for input(s): AMMONIA in the last 168 hours.  CBC: No results for input(s): WBC, NEUTROABS, HGB, HCT, MCV, PLT in the last 168 hours.  Cardiac Enzymes: No results for input(s): CKTOTAL, CKMB, CKMBINDEX, TROPONINI in the last 168 hours.  BNP (last 3 results) No results for  input(s): BNP in the last 8760 hours.  ProBNP (last 3 results) No results for input(s): PROBNP in the last 8760 hours.  Radiological Exams: Dg Chest Port 1 View  Result Date: 12/31/2018 CLINICAL DATA:  Hypoxia. EXAM: PORTABLE CHEST 1 VIEW COMPARISON:  Radiographs of Dec 23, 2018. FINDINGS: Stable cardiomediastinal silhouette. Tracheostomy tube is in good position. Feeding tube is seen entering stomach. Emphysematous disease is noted in the left lung. Stable left basilar atelectasis or scarring is noted with associated pleural effusion. Minimal right pleural effusion is noted. Bony thorax is unremarkable. IMPRESSION: Stable left basilar atelectasis or scarring is noted with associated pleural effusion. Minimal right pleural effusion. Emphysema (ICD10-J43.9). Electronically Signed   By: Lupita RaiderJames  Green Jr M.D.   On: 12/31/2018 10:16    Assessment/Plan Active Problems:   Acute on chronic respiratory failure with hypoxia (HCC)   Acute renal failure due to tubular necrosis (HCC)   Strangulated inguinal hernia   Necrotizing pneumonia (HCC)   Paroxysmal atrial fibrillation with rapid ventricular response (HCC)   ARDS (adult respiratory distress syndrome) (HCC)   Severe sepsis (HCC)   1. Acute on chronic respiratory failure with hypoxia continue with full support at this time.  Continue pulmonary toilet and secretion management 2. Acute renal failure due to tubular necrosis continue to follow 3. Strangulated inguinal  hernia at baseline continue present therapy 4. Necrotizing pneumonia treated continue to follow 5. Paradoxical atrial fibrillation rate controlled 6. ARDS follow radiologically 7. Severe sepsis hemodynamically stable   I have personally seen and evaluated the patient, evaluated laboratory and imaging results, formulated the assessment and plan and placed orders. The Patient requires high complexity decision making for assessment and support.  Case was discussed on Rounds with the  Respiratory Therapy Staff  Yevonne Pax, MD [facility] Pulmonary Critical Care Medicine Sleep Medicine

## 2019-01-01 DIAGNOSIS — J80 Acute respiratory distress syndrome: Secondary | ICD-10-CM | POA: Diagnosis not present

## 2019-01-01 DIAGNOSIS — N170 Acute kidney failure with tubular necrosis: Secondary | ICD-10-CM | POA: Diagnosis not present

## 2019-01-01 DIAGNOSIS — J9621 Acute and chronic respiratory failure with hypoxia: Secondary | ICD-10-CM | POA: Diagnosis not present

## 2019-01-01 DIAGNOSIS — J850 Gangrene and necrosis of lung: Secondary | ICD-10-CM | POA: Diagnosis not present

## 2019-01-01 NOTE — Progress Notes (Addendum)
Pulmonary Critical Care Medicine [facility] [place]   PULMONARY CRITICAL CARE SERVICE  PROGRESS NOTE  Date of Service: 01/01/2019  [name]  YIA:[id number]  DOB: November 10, 1955   DOA: 12/14/2018  Referring Physician: Carron Curie, MD  HPI: Benjamin [name] is a 63 y.o. male seen for follow up of Acute on Chronic Respiratory Failure.  Patient remains full support at this time with FiO2 of 35%.  Medications: Reviewed on Rounds  Physical Exam:  Vitals: 91 respirations 27 BP 88/50 O2 sat 98% temp 98.0  Ventilator Settings ventilator mode AC VC rate 26 tidal volume 450 PEEP of 10 FiO2 35%  . General: Comfortable at this time . Eyes: Grossly normal lids, irises & conjunctiva . ENT: grossly tongue is normal . Neck: no obvious mass . Cardiovascular: S1 S2 normal no gallop . Respiratory: No rales or rhonchi noted . Abdomen: soft . Skin: no rash seen on limited exam . Musculoskeletal: not rigid . Psychiatric:unable to assess . Neurologic: no seizure no involuntary movements         Lab Data:   Basic Metabolic Panel: Recent Labs  Lab 12/26/18 0659 12/28/18 0618 12/29/18 0536  NA 154* 149*  --   K 3.5 3.1* 3.9  CL 117* 113*  --   CO2 27 27  --   GLUCOSE 98 93  --   BUN 52* 47*  --   CREATININE 0.82 0.78  --   CALCIUM 8.2* 8.3*  --     ABG: Recent Labs  Lab 12/31/18 0250  PHART 7.338*  PCO2ART 53.8*  PO2ART 88.4  HCO3 28.1*  O2SAT 96.6    Liver Function Tests: No results for input(s): AST, ALT, ALKPHOS, BILITOT, PROT, ALBUMIN in the last 168 hours. No results for input(s): LIPASE, AMYLASE in the last 168 hours. No results for input(s): AMMONIA in the last 168 hours.  CBC: No results for input(s): WBC, NEUTROABS, HGB, HCT, MCV, PLT in the last 168 hours.  Cardiac Enzymes: No results for input(s): CKTOTAL, CKMB, CKMBINDEX, TROPONINI in the last 168 hours.  BNP (last 3 results) No results for input(s): BNP in the last 8760 hours.  ProBNP (last 3  results) No results for input(s): PROBNP in the last 8760 hours.  Radiological Exams: Dg Chest Port 1 View  Result Date: 12/31/2018 CLINICAL DATA:  Hypoxia. EXAM: PORTABLE CHEST 1 VIEW COMPARISON:  Radiographs of Dec 23, 2018. FINDINGS: Stable cardiomediastinal silhouette. Tracheostomy tube is in good position. Feeding tube is seen entering stomach. Emphysematous disease is noted in the left lung. Stable left basilar atelectasis or scarring is noted with associated pleural effusion. Minimal right pleural effusion is noted. Bony thorax is unremarkable. IMPRESSION: Stable left basilar atelectasis or scarring is noted with associated pleural effusion. Minimal right pleural effusion. Emphysema (ICD10-J43.9). Electronically Signed   By: Lupita Raider M.D.   On: 12/31/2018 10:16    Assessment/Plan Active Problems:   Acute on chronic respiratory failure with hypoxia (HCC)   Acute renal failure due to tubular necrosis (HCC)   Strangulated inguinal hernia   Necrotizing pneumonia (HCC)   Paroxysmal atrial fibrillation with rapid ventricular response (HCC)   ARDS (adult respiratory distress syndrome) (HCC)   Severe sepsis (HCC)   1. Acute on chronic respiratory failure with hypoxia continue with full support at this time.  Continue pulmonary toilet and secretion management 2. Acute renal failure due to tubular necrosis continue to follow 3. Strangulated inguinal hernia at baseline continue present therapy 4. Necrotizing pneumonia  treated continue to follow 5. Paradoxical atrial fibrillation rate controlled 6. ARDS follow radiologically 7. Severe sepsis hemodynamically stable   I have personally seen and evaluated the patient, evaluated laboratory and imaging results, formulated the assessment and plan and placed orders. The Patient requires high complexity decision making for assessment and support.  Case was discussed on Rounds with the Respiratory Therapy Staff  Yevonne PaxSaadat A Newt Levingston, MD  [facility] Pulmonary Critical Care Medicine Sleep Medicine

## 2019-01-02 DIAGNOSIS — J850 Gangrene and necrosis of lung: Secondary | ICD-10-CM | POA: Diagnosis not present

## 2019-01-02 DIAGNOSIS — J80 Acute respiratory distress syndrome: Secondary | ICD-10-CM | POA: Diagnosis not present

## 2019-01-02 DIAGNOSIS — N170 Acute kidney failure with tubular necrosis: Secondary | ICD-10-CM | POA: Diagnosis not present

## 2019-01-02 DIAGNOSIS — J9621 Acute and chronic respiratory failure with hypoxia: Secondary | ICD-10-CM | POA: Diagnosis not present

## 2019-01-02 LAB — BASIC METABOLIC PANEL
Anion gap: 13 (ref 5–15)
BUN: 58 mg/dL — ABNORMAL HIGH (ref 8–23)
CO2: 23 mmol/L (ref 22–32)
Calcium: 8.2 mg/dL — ABNORMAL LOW (ref 8.9–10.3)
Chloride: 113 mmol/L — ABNORMAL HIGH (ref 98–111)
Creatinine, Ser: 0.96 mg/dL (ref 0.61–1.24)
GFR calc Af Amer: >60 mL/min (ref >60)
GFR calc non Af Amer: >60 mL/min (ref >60)
Glucose, Bld: 91 mg/dL (ref 70–99)
Potassium: 3 mmol/L — ABNORMAL LOW (ref 3.5–5.1)
Sodium: 149 mmol/L — ABNORMAL HIGH (ref 135–145)

## 2019-01-02 LAB — CBC
HCT: 22.6 % — ABNORMAL LOW (ref 39.0–52.0)
Hemoglobin: 6.7 g/dL — CL (ref 13.0–17.0)
MCH: 30.5 pg (ref 26.0–34.0)
MCHC: 29.6 g/dL — ABNORMAL LOW (ref 30.0–36.0)
MCV: 102.7 fL — ABNORMAL HIGH (ref 80.0–100.0)
Platelets: 159 10*3/uL (ref 150–400)
RBC: 2.2 MIL/uL — ABNORMAL LOW (ref 4.22–5.81)
RDW: 18.7 % — ABNORMAL HIGH (ref 11.5–15.5)
WBC: 9.1 10*3/uL (ref 4.0–10.5)
nRBC: 0 % (ref 0.0–0.2)

## 2019-01-02 LAB — ABO/RH: ABO/RH(D): A POS

## 2019-01-02 LAB — IRON AND TIBC
Iron: 59 ug/dL (ref 45–182)
Saturation Ratios: 42 % — ABNORMAL HIGH (ref 17.9–39.5)
TIBC: 141 ug/dL — ABNORMAL LOW (ref 250–450)
UIBC: 82 ug/dL

## 2019-01-02 LAB — FERRITIN: Ferritin: 562 ng/mL — ABNORMAL HIGH (ref 24–336)

## 2019-01-02 LAB — PREPARE RBC (CROSSMATCH)

## 2019-01-02 NOTE — Progress Notes (Addendum)
Pulmonary Critical Care Medicine [facility] [place]   PULMONARY CRITICAL CARE SERVICE  PROGRESS NOTE  Date of Service: 01/02/2019  HARISH TOUGAS  FBP:[id number]  DOB: 1955/10/26   DOA: 12/14/2018  Referring Physician: Carron Curie, MD  HPI: Benjamin [name] is a 63 y.o. male seen for follow up of Acute on Chronic Respiratory Failure.  Patient remains on full support at this time with a rate still at 26 and FiO2 this been decreased to 50%.  Satting well with no distress.  Medications: Reviewed on Rounds  Physical Exam:  Vitals: Pulse 82 respirations 13 BP 96/52 O2 sat 95% temp 97.2  Ventilator Settings ventilator mode AC VC rate of 26 tidal volume 450 PEEP of 7 FiO2 50%  . General: Comfortable at this time . Eyes: Grossly normal lids, irises & conjunctiva . ENT: grossly tongue is normal . Neck: no obvious mass . Cardiovascular: S1 S2 normal no gallop . Respiratory: No rales or rhonchi noted . Abdomen: soft . Skin: no rash seen on limited exam . Musculoskeletal: not rigid . Psychiatric:unable to assess . Neurologic: no seizure no involuntary movements         Lab Data:   Basic Metabolic Panel: Recent Labs  Lab 12/28/18 0618 12/29/18 0536 01/02/19 0537  NA 149*  --  149*  K 3.1* 3.9 3.0*  CL 113*  --  113*  CO2 27  --  23  GLUCOSE 93  --  91  BUN 47*  --  58*  CREATININE 0.78  --  0.96  CALCIUM 8.3*  --  8.2*    ABG: Recent Labs  Lab 12/31/18 0250  PHART 7.338*  PCO2ART 53.8*  PO2ART 88.4  HCO3 28.1*  O2SAT 96.6    Liver Function Tests: No results for input(s): AST, ALT, ALKPHOS, BILITOT, PROT, ALBUMIN in the last 168 hours. No results for input(s): LIPASE, AMYLASE in the last 168 hours. No results for input(s): AMMONIA in the last 168 hours.  CBC: Recent Labs  Lab 01/02/19 0537  WBC 9.1  HGB 6.7*  HCT 22.6*  MCV 102.7*  PLT 159    Cardiac Enzymes: No results for input(s): CKTOTAL, CKMB, CKMBINDEX, TROPONINI in the last 168  hours.  BNP (last 3 results) No results for input(s): BNP in the last 8760 hours.  ProBNP (last 3 results) No results for input(s): PROBNP in the last 8760 hours.  Radiological Exams: No results found.  Assessment/Plan Active Problems:   Acute on chronic respiratory failure with hypoxia (HCC)   Acute renal failure due to tubular necrosis (HCC)   Strangulated inguinal hernia   Necrotizing pneumonia (HCC)   Paroxysmal atrial fibrillation with rapid ventricular response (HCC)   ARDS (adult respiratory distress syndrome) (HCC)   Severe sepsis (HCC)   1. Acute on chronic respiratory failure with hypoxia continue with full support at this time.  Continue secretion management pulmonary toilet 2. Acute renal failure due to tubular necrosis continue to follow 3. Strangulated inguinal hernia at baseline 4. Necrotizing pneumonia treated continue to follow 5. Proximal atrial fibrillation rate controlled 6. ARDS follow radiologically 7. Severe sepsis hemodynamically stable   I have personally seen and evaluated the patient, evaluated laboratory and imaging results, formulated the assessment and plan and placed orders. The Patient requires high complexity decision making for assessment and support.  Case was discussed on Rounds with the Respiratory Therapy Staff  Yevonne Pax, MD [facility] Pulmonary Critical Care Medicine Sleep Medicine

## 2019-01-03 DIAGNOSIS — N170 Acute kidney failure with tubular necrosis: Secondary | ICD-10-CM | POA: Diagnosis not present

## 2019-01-03 DIAGNOSIS — J9621 Acute and chronic respiratory failure with hypoxia: Secondary | ICD-10-CM | POA: Diagnosis not present

## 2019-01-03 DIAGNOSIS — J80 Acute respiratory distress syndrome: Secondary | ICD-10-CM | POA: Diagnosis not present

## 2019-01-03 DIAGNOSIS — J850 Gangrene and necrosis of lung: Secondary | ICD-10-CM | POA: Diagnosis not present

## 2019-01-03 LAB — TYPE AND SCREEN
ABO/RH(D): A POS
Antibody Screen: NEGATIVE
Unit division: 0

## 2019-01-03 LAB — CBC
HCT: 24.8 % — ABNORMAL LOW (ref 39.0–52.0)
Hemoglobin: 7.6 g/dL — ABNORMAL LOW (ref 13.0–17.0)
MCH: 30.8 pg (ref 26.0–34.0)
MCHC: 30.6 g/dL (ref 30.0–36.0)
MCV: 100.4 fL — ABNORMAL HIGH (ref 80.0–100.0)
Platelets: 175 10*3/uL (ref 150–400)
RBC: 2.47 MIL/uL — ABNORMAL LOW (ref 4.22–5.81)
RDW: 18.8 % — ABNORMAL HIGH (ref 11.5–15.5)
WBC: 7.9 10*3/uL (ref 4.0–10.5)
nRBC: 0 % (ref 0.0–0.2)

## 2019-01-03 LAB — BPAM RBC
Blood Product Expiration Date: 202006092359
ISSUE DATE / TIME: 202005241321
Unit Type and Rh: 6200

## 2019-01-03 LAB — POTASSIUM: Potassium: 3.2 mmol/L — ABNORMAL LOW (ref 3.5–5.1)

## 2019-01-03 LAB — VITAMIN B12: Vitamin B-12: 300 pg/mL (ref 180–914)

## 2019-01-03 NOTE — Progress Notes (Addendum)
Pulmonary Critical Care Medicine [facility] [place]   PULMONARY CRITICAL CARE SERVICE  PROGRESS NOTE  Date of Service: 01/03/2019  Benjamin [name]  HLK:[id number]  DOB: 04-17-1956   DOA: 12/14/2018  Referring Physician: Carron Curie, MD  HPI: Benjamin [name] is a 63 y.o. male seen for follow up of Acute on Chronic Respiratory Failure.  Patient was able to do aerosol trach collar 50% FiO2 for 4 hours today and is now on pressure support for 12 hours doing well at this time.  Medications: Reviewed on Rounds  Physical Exam:  Vitals: Pulse 82 respirations 15 BP 100/45 O2 sat 92% temp 96.6  Ventilator Settings pressure support 12/5 FiO2 of 50%  . General: Comfortable at this time . Eyes: Grossly normal lids, irises & conjunctiva . ENT: grossly tongue is normal . Neck: no obvious mass . Cardiovascular: S1 S2 normal no gallop . Respiratory: No rales or rhonchi noted . Abdomen: soft . Skin: no rash seen on limited exam . Musculoskeletal: not rigid . Psychiatric:unable to assess . Neurologic: no seizure no involuntary movements         Lab Data:   Basic Metabolic Panel: Recent Labs  Lab 12/28/18 0618 12/29/18 0536 01/02/19 0537 01/03/19 0546  NA 149*  --  149*  --   K 3.1* 3.9 3.0* 3.2*  CL 113*  --  113*  --   CO2 27  --  23  --   GLUCOSE 93  --  91  --   BUN 47*  --  58*  --   CREATININE 0.78  --  0.96  --   CALCIUM 8.3*  --  8.2*  --     ABG: Recent Labs  Lab 12/31/18 0250  PHART 7.338*  PCO2ART 53.8*  PO2ART 88.4  HCO3 28.1*  O2SAT 96.6    Liver Function Tests: No results for input(s): AST, ALT, ALKPHOS, BILITOT, PROT, ALBUMIN in the last 168 hours. No results for input(s): LIPASE, AMYLASE in the last 168 hours. No results for input(s): AMMONIA in the last 168 hours.  CBC: Recent Labs  Lab 01/02/19 0537 01/03/19 0546  WBC 9.1 7.9  HGB 6.7* 7.6*  HCT 22.6* 24.8*  MCV 102.7* 100.4*  PLT 159 175    Cardiac Enzymes: No results for  input(s): CKTOTAL, CKMB, CKMBINDEX, TROPONINI in the last 168 hours.  BNP (last 3 results) No results for input(s): BNP in the last 8760 hours.  ProBNP (last 3 results) No results for input(s): PROBNP in the last 8760 hours.  Radiological Exams: No results found.  Assessment/Plan Active Problems:   Acute on chronic respiratory failure with hypoxia (HCC)   Acute renal failure due to tubular necrosis (HCC)   Strangulated inguinal hernia   Necrotizing pneumonia (HCC)   Paroxysmal atrial fibrillation with rapid ventricular response (HCC)   ARDS (adult respiratory distress syndrome) (HCC)   Severe sepsis (HCC)   1. Acute on chronic respiratory failure with hypoxia continue to wean per protocol at this time..  Continue secretion management pulmonary toilet 2. Acute renal failure due to tubular necrosis continue to follow 3. Strangulated inguinal hernia at baseline 4. Necrotizing pneumonia treated continue to follow 5. Proximal atrial fibrillation rate controlled 6. ARDS follow radiologically 7. Severe sepsis hemodynamically stable   I have personally seen and evaluated the patient, evaluated laboratory and imaging results, formulated the assessment and plan and placed orders. The Patient requires high complexity decision making for assessment and support.  Case was discussed on  Rounds with the Respiratory Therapy Staff  Allyne Gee, MD [facility] Pulmonary Critical Care Medicine Sleep Medicine

## 2019-01-04 DIAGNOSIS — J80 Acute respiratory distress syndrome: Secondary | ICD-10-CM | POA: Diagnosis not present

## 2019-01-04 DIAGNOSIS — J9621 Acute and chronic respiratory failure with hypoxia: Secondary | ICD-10-CM | POA: Diagnosis not present

## 2019-01-04 DIAGNOSIS — N170 Acute kidney failure with tubular necrosis: Secondary | ICD-10-CM | POA: Diagnosis not present

## 2019-01-04 DIAGNOSIS — J850 Gangrene and necrosis of lung: Secondary | ICD-10-CM | POA: Diagnosis not present

## 2019-01-04 LAB — FOLATE RBC
Folate, Hemolysate: 228 ng/mL
Folate, RBC: 1051 ng/mL (ref >498)
Hematocrit: 21.7 % — ABNORMAL LOW (ref 37.5–51.0)

## 2019-01-04 NOTE — Progress Notes (Addendum)
Pulmonary Critical Care Medicine [facility] [place]   PULMONARY CRITICAL CARE SERVICE  PROGRESS NOTE  Date of Service: 01/04/2019  CALYN ARENDALL  IEP:[id number]  DOB: 11/25/1955   DOA: 12/14/2018  Referring Physician: Carron Curie, MD  HPI: KEYDON MARCO is a 63 y.o. male seen for follow up of Acute on Chronic Respiratory Failure.  Patient continues on support today and has been unable to wean to aerosol trach collar today due to tachypnea and increased work of breathing.  Medications: Reviewed on Rounds  Physical Exam:  Vitals: Pulse 89 respiration 17 BP 139/55 O2 sat 96% temp 97.3  Ventilator Settings ventilator mode AC VC rate of 26 tidal volume 450 PEEP of 7 FiO2 50%  . General: Comfortable at this time . Eyes: Grossly normal lids, irises & conjunctiva . ENT: grossly tongue is normal . Neck: no obvious mass . Cardiovascular: S1 S2 normal no gallop . Respiratory: No rales or rhonchi noted . Abdomen: soft . Skin: no rash seen on limited exam . Musculoskeletal: not rigid . Psychiatric:unable to assess . Neurologic: no seizure no involuntary movements         Lab Data:   Basic Metabolic Panel: Recent Labs  Lab 12/29/18 0536 01/02/19 0537 01/03/19 0546  NA  --  149*  --   K 3.9 3.0* 3.2*  CL  --  113*  --   CO2  --  23  --   GLUCOSE  --  91  --   BUN  --  58*  --   CREATININE  --  0.96  --   CALCIUM  --  8.2*  --     ABG: Recent Labs  Lab 12/31/18 0250  PHART 7.338*  PCO2ART 53.8*  PO2ART 88.4  HCO3 28.1*  O2SAT 96.6    Liver Function Tests: No results for input(s): AST, ALT, ALKPHOS, BILITOT, PROT, ALBUMIN in the last 168 hours. No results for input(s): LIPASE, AMYLASE in the last 168 hours. No results for input(s): AMMONIA in the last 168 hours.  CBC: Recent Labs  Lab 01/02/19 0537 01/03/19 0546 01/03/19 1100  WBC 9.1 7.9  --   HGB 6.7* 7.6*  --   HCT 22.6* 24.8* 21.7*  MCV 102.7* 100.4*  --   PLT 159 175  --     Cardiac  Enzymes: No results for input(s): CKTOTAL, CKMB, CKMBINDEX, TROPONINI in the last 168 hours.  BNP (last 3 results) No results for input(s): BNP in the last 8760 hours.  ProBNP (last 3 results) No results for input(s): PROBNP in the last 8760 hours.  Radiological Exams: No results found.  Assessment/Plan Active Problems:   Acute on chronic respiratory failure with hypoxia (HCC)   Acute renal failure due to tubular necrosis (HCC)   Strangulated inguinal hernia   Necrotizing pneumonia (HCC)   Paroxysmal atrial fibrillation with rapid ventricular response (HCC)   ARDS (adult respiratory distress syndrome) (HCC)   Severe sepsis (HCC)   1. Acute on chronic respiratory failure with hypoxia continue weaning per protocol at this time.  Patient remains on full support today as unable to tolerate weaning to aerosol trach collar.  We will continue supportive measures and pulmonary toilet this time. 2. Acute renal failure tubular necrosis continue to follow 3. Strangulated inguinal hernia at baseline 4. Necrotizing pneumonia treated continue to follow 5. Paroxysmal atrial fibrillation rate control 6. ARDS follow radiologically 7. Severe sepsis hemodynamically stable   I have personally seen and evaluated the patient, evaluated  laboratory and imaging results, formulated the assessment and plan and placed orders. The Patient requires high complexity decision making for assessment and support.  Case was discussed on Rounds with the Respiratory Therapy Staff  Allyne Gee, MD [facility] Pulmonary Critical Care Medicine Sleep Medicine

## 2019-01-05 ENCOUNTER — Other Ambulatory Visit (HOSPITAL_COMMUNITY): Payer: Self-pay

## 2019-01-05 DIAGNOSIS — J850 Gangrene and necrosis of lung: Secondary | ICD-10-CM | POA: Diagnosis not present

## 2019-01-05 DIAGNOSIS — J9621 Acute and chronic respiratory failure with hypoxia: Secondary | ICD-10-CM | POA: Diagnosis not present

## 2019-01-05 DIAGNOSIS — J80 Acute respiratory distress syndrome: Secondary | ICD-10-CM | POA: Diagnosis not present

## 2019-01-05 DIAGNOSIS — N170 Acute kidney failure with tubular necrosis: Secondary | ICD-10-CM | POA: Diagnosis not present

## 2019-01-05 LAB — BASIC METABOLIC PANEL
Anion gap: 9 (ref 5–15)
BUN: 41 mg/dL — ABNORMAL HIGH (ref 8–23)
CO2: 26 mmol/L (ref 22–32)
Calcium: 8.2 mg/dL — ABNORMAL LOW (ref 8.9–10.3)
Chloride: 115 mmol/L — ABNORMAL HIGH (ref 98–111)
Creatinine, Ser: 0.68 mg/dL (ref 0.61–1.24)
GFR calc Af Amer: >60 mL/min (ref >60)
GFR calc non Af Amer: >60 mL/min (ref >60)
Glucose, Bld: 97 mg/dL (ref 70–99)
Potassium: 3.1 mmol/L — ABNORMAL LOW (ref 3.5–5.1)
Sodium: 150 mmol/L — ABNORMAL HIGH (ref 135–145)

## 2019-01-05 LAB — CBC
HCT: 23.5 % — ABNORMAL LOW (ref 39.0–52.0)
Hemoglobin: 7.1 g/dL — ABNORMAL LOW (ref 13.0–17.0)
MCH: 30.7 pg (ref 26.0–34.0)
MCHC: 30.2 g/dL (ref 30.0–36.0)
MCV: 101.7 fL — ABNORMAL HIGH (ref 80.0–100.0)
Platelets: 193 10*3/uL (ref 150–400)
RBC: 2.31 MIL/uL — ABNORMAL LOW (ref 4.22–5.81)
RDW: 19.3 % — ABNORMAL HIGH (ref 11.5–15.5)
WBC: 6.5 10*3/uL (ref 4.0–10.5)
nRBC: 0 % (ref 0.0–0.2)

## 2019-01-05 LAB — OCCULT BLOOD X 1 CARD TO LAB, STOOL: Fecal Occult Bld: POSITIVE — AB

## 2019-01-05 NOTE — Progress Notes (Addendum)
Pulmonary Critical Care Medicine [facility] [place]   PULMONARY CRITICAL CARE [name]  PROGRESS NOTE  Date of [name]: 01/05/2019  Benjamin [name]  WUJ:[id number]  DOB: 02-Oct-1955   DOA: 12/14/2018  Referring Physician: Carron Curie, MD  HPI: Benjamin [name] is a 63 y.o. male seen for follow up of Acute on Chronic Respiratory Failure.  Has an 8-hour goal on aerosol trach collar currently.  Will attempt again this afternoon patient remains on 50% FiO2 on full support on the ventilator at this time.  Medications: Reviewed on Rounds  Physical Exam:  Vitals: Pulse 88 respirations 13 BP 115/54 O2 sat 94% temp 97.0  Ventilator Settings Ventilator mode AC VC rate of 26 tidal of 450 PEEP of 7 FiO2 of 50%  . General: Comfortable at this time . Eyes: Grossly normal lids, irises & conjunctiva . ENT: grossly tongue is normal . Neck: no obvious mass . Cardiovascular: S1 S2 normal no gallop . Respiratory: No rales or rhonchi noted . Abdomen: soft . Skin: no rash seen on limited exam . Musculoskeletal: not rigid . Psychiatric:unable to assess . Neurologic: no seizure no involuntary movements         Lab Data:   Basic Metabolic Panel: Recent Labs  Lab 01/02/19 0537 01/03/19 0546 01/05/19 0528  NA 149*  --  150*  K 3.0* 3.2* 3.1*  CL 113*  --  115*  CO2 23  --  26  GLUCOSE 91  --  97  BUN 58*  --  41*  CREATININE 0.96  --  0.68  CALCIUM 8.2*  --  8.2*    ABG: Recent Labs  Lab 12/31/18 0250  PHART 7.338*  PCO2ART 53.8*  PO2ART 88.4  HCO3 28.1*  O2SAT 96.6    Liver Function Tests: No results for input(s): AST, ALT, ALKPHOS, BILITOT, PROT, ALBUMIN in the last 168 hours. No results for input(s): LIPASE, AMYLASE in the last 168 hours. No results for input(s): AMMONIA in the last 168 hours.  CBC: Recent Labs  Lab 01/02/19 0537 01/03/19 0546 01/03/19 1100 01/05/19 0528  WBC 9.1 7.9  --  6.5  HGB 6.7* 7.6*  --  7.1*  HCT 22.6* 24.8* 21.7* 23.5*  MCV  102.7* 100.4*  --  101.7*  PLT 159 175  --  193    Cardiac Enzymes: No results for input(s): CKTOTAL, CKMB, CKMBINDEX, TROPONINI in the last 168 hours.  BNP (last 3 results) No results for input(s): BNP in the last 8760 hours.  ProBNP (last 3 results) No results for input(s): PROBNP in the last 8760 hours.  Radiological Exams: [place] Ascites (abdomen Limited)  Result Date: 01/05/2019 CLINICAL DATA:  Evaluation for gastrostomy tube placement. Prior cross-sectional imaging demonstrated ascites. Assessing ascites volume prior to possible percutaneous gastrostomy tube placement. EXAM: LIMITED ABDOMEN ULTRASOUND FOR ASCITES TECHNIQUE: Limited ultrasound survey for ascites was performed in all four abdominal quadrants. COMPARISON:  CT of the abdomen 12/15/2018 FINDINGS: Largest amount of ascites appears to be in the upper midline. There is a small amount of perihepatic ascites. No significant ascites in the right lower quadrant or left upper quadrant. Trace ascites in the left lower quadrant. IMPRESSION: Small amount of ascites in the abdomen but the largest amount of fluid is located in the upper abdominal midline and around the liver. Electronically Signed   By: Richarda Overlie M.D.   On: 01/05/2019 16:49    Assessment/Plan Active Problems:   Acute on chronic respiratory failure with hypoxia (HCC)  Acute renal failure due to tubular necrosis (HCC)   Strangulated inguinal hernia   Necrotizing pneumonia (HCC)   Paroxysmal atrial fibrillation with rapid ventricular response (HCC)   ARDS (adult respiratory distress syndrome) (HCC)   Severe sepsis (HCC)   1. Acute on chronic respiratory failure with hypoxia continue weaning per protocol at this time.    Patient was unable to wean this morning and will try again this afternoon has a goal of 8 hours on aerosol trach collar today. We will continue supportive measures and pulmonary toilet this time. 2. Acute renal failure tubular necrosis continue to  follow 3. Strangulated inguinal hernia at baseline 4. Necrotizing pneumonia treated continue to follow 5. Paroxysmal atrial fibrillation rate control 6. ARDS follow radiologically 7. Severe sepsis hemodynamically stable   I have personally seen and evaluated the patient, evaluated laboratory and imaging results, formulated the assessment and plan and placed orders. The Patient requires high complexity decision making for assessment and support.  Case was discussed on Rounds with the Respiratory Therapy Staff  Yevonne PaxSaadat A Chayse Gracey, MD [facility] Pulmonary Critical Care Medicine Sleep Medicine

## 2019-01-06 DIAGNOSIS — N170 Acute kidney failure with tubular necrosis: Secondary | ICD-10-CM | POA: Diagnosis not present

## 2019-01-06 DIAGNOSIS — J850 Gangrene and necrosis of lung: Secondary | ICD-10-CM | POA: Diagnosis not present

## 2019-01-06 DIAGNOSIS — J9621 Acute and chronic respiratory failure with hypoxia: Secondary | ICD-10-CM | POA: Diagnosis not present

## 2019-01-06 DIAGNOSIS — J80 Acute respiratory distress syndrome: Secondary | ICD-10-CM | POA: Diagnosis not present

## 2019-01-06 LAB — CBC
HCT: 23.9 % — ABNORMAL LOW (ref 39.0–52.0)
Hemoglobin: 7.3 g/dL — ABNORMAL LOW (ref 13.0–17.0)
MCH: 30.8 pg (ref 26.0–34.0)
MCHC: 30.5 g/dL (ref 30.0–36.0)
MCV: 100.8 fL — ABNORMAL HIGH (ref 80.0–100.0)
Platelets: 194 10*3/uL (ref 150–400)
RBC: 2.37 MIL/uL — ABNORMAL LOW (ref 4.22–5.81)
RDW: 19.5 % — ABNORMAL HIGH (ref 11.5–15.5)
WBC: 6.6 10*3/uL (ref 4.0–10.5)
nRBC: 0 % (ref 0.0–0.2)

## 2019-01-06 LAB — BASIC METABOLIC PANEL
Anion gap: 5 (ref 5–15)
BUN: 40 mg/dL — ABNORMAL HIGH (ref 8–23)
CO2: 26 mmol/L (ref 22–32)
Calcium: 8.2 mg/dL — ABNORMAL LOW (ref 8.9–10.3)
Chloride: 114 mmol/L — ABNORMAL HIGH (ref 98–111)
Creatinine, Ser: 0.71 mg/dL (ref 0.61–1.24)
GFR calc Af Amer: >60 mL/min (ref >60)
GFR calc non Af Amer: >60 mL/min (ref >60)
Glucose, Bld: 104 mg/dL — ABNORMAL HIGH (ref 70–99)
Potassium: 3.7 mmol/L (ref 3.5–5.1)
Sodium: 145 mmol/L (ref 135–145)

## 2019-01-06 NOTE — Progress Notes (Signed)
Patient with history of aspiration pneumonia with severe ARDS and sepsis. He underwent open hernia repair which was complicated by above.  He is now on trach/vent with NGT.  IR asked to evaluate for possible gastrostomy tube placement.   CT reviewed by Dr. Miles Costain.  Patient with open midline abdominal wound as well as a RUQ ascites demonstrated on [place] yesterday.  PA assessed wound which does appear to be healing well, however, still open, with drainage, requiring packing and does extend into the upper abdomen. Percutaneous placement is high risk for this patient at this time.  No placement planned in IR, consider surgical consult for gastrostomy tube placement.   Loyce Dys, MS RD PA-C 11:19 AM

## 2019-01-06 NOTE — Progress Notes (Signed)
Pulmonary Critical Care Medicine [facility] [place]   PULMONARY CRITICAL CARE SERVICE  PROGRESS NOTE  Date of Service: 01/06/2019  RAISTLIN GUM  ZOX:[id number]  DOB: 06/10/1956   DOA: 12/14/2018  Referring Physician: Carron Curie, MD  HPI: Benjamin [name] is a 63 y.o. male seen for follow up of Acute on Chronic Respiratory Failure.  Patient currently is on full support on the ventilator was attempted on T collar yesterday and failed  Medications: Reviewed on Rounds  Physical Exam:  Vitals: Temperature 95.6 pulse 74 respiratory 26 blood pressure 147/56 saturations 100%  Ventilator Settings mode of ventilation assist control FiO2 50% tidal volume 450 PEEP 8  . General: Comfortable at this time . Eyes: Grossly normal lids, irises & conjunctiva . ENT: grossly tongue is normal . Neck: no obvious mass . Cardiovascular: S1 S2 normal no gallop . Respiratory: Scattered rhonchi expansion is equal . Abdomen: soft . Skin: no rash seen on limited exam . Musculoskeletal: not rigid . Psychiatric:unable to assess . Neurologic: no seizure no involuntary movements         Lab Data:   Basic Metabolic Panel: Recent Labs  Lab 01/02/19 0537 01/03/19 0546 01/05/19 0528 01/06/19 0454  NA 149*  --  150* 145  K 3.0* 3.2* 3.1* 3.7  CL 113*  --  115* 114*  CO2 23  --  26 26  GLUCOSE 91  --  97 104*  BUN 58*  --  41* 40*  CREATININE 0.96  --  0.68 0.71  CALCIUM 8.2*  --  8.2* 8.2*    ABG: Recent Labs  Lab 12/31/18 0250  PHART 7.338*  PCO2ART 53.8*  PO2ART 88.4  HCO3 28.1*  O2SAT 96.6    Liver Function Tests: No results for input(s): AST, ALT, ALKPHOS, BILITOT, PROT, ALBUMIN in the last 168 hours. No results for input(s): LIPASE, AMYLASE in the last 168 hours. No results for input(s): AMMONIA in the last 168 hours.  CBC: Recent Labs  Lab 01/02/19 0537 01/03/19 0546 01/03/19 1100 01/05/19 0528 01/06/19 0454  WBC 9.1 7.9  --  6.5 6.6  HGB 6.7* 7.6*  --  7.1*  7.3*  HCT 22.6* 24.8* 21.7* 23.5* 23.9*  MCV 102.7* 100.4*  --  101.7* 100.8*  PLT 159 175  --  193 194    Cardiac Enzymes: No results for input(s): CKTOTAL, CKMB, CKMBINDEX, TROPONINI in the last 168 hours.  BNP (last 3 results) No results for input(s): BNP in the last 8760 hours.  ProBNP (last 3 results) No results for input(s): PROBNP in the last 8760 hours.  Radiological Exams: Ct Chest Wo Contrast  Result Date: 01/06/2019 CLINICAL DATA:  Inpatient. Clinical concern for interstitial lung disease. Necrotizing pneumonia. Acute on chronic respiratory failure with hypoxia. EXAM: CT CHEST WITHOUT CONTRAST TECHNIQUE: Multidetector CT imaging of the chest was performed following the standard protocol without IV contrast. COMPARISON:  12/20/2018 chest CT.  12/31/2018 chest radiograph. FINDINGS: Cardiovascular: Normal heart size. No significant pericardial effusion/thickening. Left anterior descending coronary atherosclerosis. Atherosclerotic nonaneurysmal thoracic aorta. Normal caliber pulmonary arteries. Mediastinum/Nodes: No discrete thyroid nodules. Unremarkable esophagus. No axillary adenopathy. Stable mildly enlarged 1.2 cm subcarinal node (series 3/image 82). No additional pathologically enlarged mediastinal nodes. Stable mildly enlarged 1.1 cm right hilar node (series 3/image 86). No discrete left hilar adenopathy on this noncontrast scan. Lungs/Pleura: No pneumothorax. Small right and trace left dependent pleural effusions, stable. Tracheostomy tube terminates in the trachea just below the level of the thoracic inlet. Severe  centrilobular and paraseptal emphysema with diffuse bronchial wall thickening. Left lower lobe airways remain occluded by mucoid material. Improved aeration of the left mainstem bronchus and left upper lobe airways, with residual mucoid material in the left mainstem bronchus. Near complete left lower lobe atelectasis with slightly improved left lower lobe aeration in the  superior segment. Patchy nodular foci of consolidation throughout the dependent lungs bilaterally, predominantly centrilobular in distribution, stable to decreased in size, some with new central cystic change. For example a posterior right upper lobe 1.9 cm nodule demonstrates new central cystic change (series 4/image 30), previously 1.9 cm, stable in size. Posterior left upper lobe 1.7 cm solid nodule (series 4/image 31), decreased from 2.1 cm. Moderate dependent right lower lobe atelectasis, stable. No new significant pulmonary nodules. No significant regions of subpleural reticulation or frank honeycombing. Upper abdomen: Small volume upper abdominal ascites. Enteric tube terminates in the body of the stomach. Musculoskeletal: No aggressive appearing focal osseous lesions. Mild thoracic spondylosis. IMPRESSION: 1. Severe centrilobular and paraseptal emphysema with diffuse bronchial wall thickening, suggesting COPD. Well-positioned tracheostomy tube. 2. Extensive patchy generally centrilobular nodular foci of consolidation throughout the dependent lungs, stable to mildly decreased since 12/20/2018 chest CT, favoring multilobar pneumonia/aspiration. New central cystic change within some of pulmonary nodules, which could represent cavitary infection versus mucoid clearance within cystic foci of bronchiectasis. Suggest follow-up chest CT in 3-6 months in this high risk patient to exclude underlying neoplasm, which is less favored. 3. Persistent occlusion of the left lower lobe airways by mucoid impaction. Near complete left lower lobe atelectasis with some improved aeration in the superior segment of the left lower lobe. 4. Small left and trace right dependent pleural effusions, stable. 5. No evidence of underlying interstitial lung disease. 6. One vessel coronary atherosclerosis. 7. Nonspecific mild mediastinal and right hilar adenopathy, stable, more likely reactive, which can also be reassessed on follow-up chest  CT in 3-6 months. Aortic Atherosclerosis (ICD10-I70.0) and Emphysema (ICD10-J43.9). Electronically Signed   By: Delbert PhenixJason A Poff M.D.   On: 01/06/2019 08:31   [place] Ascites (abdomen Limited)  Result Date: 01/05/2019 CLINICAL DATA:  Evaluation for gastrostomy tube placement. Prior cross-sectional imaging demonstrated ascites. Assessing ascites volume prior to possible percutaneous gastrostomy tube placement. EXAM: LIMITED ABDOMEN ULTRASOUND FOR ASCITES TECHNIQUE: Limited ultrasound survey for ascites was performed in all four abdominal quadrants. COMPARISON:  CT of the abdomen 12/15/2018 FINDINGS: Largest amount of ascites appears to be in the upper midline. There is a small amount of perihepatic ascites. No significant ascites in the right lower quadrant or left upper quadrant. Trace ascites in the left lower quadrant. IMPRESSION: Small amount of ascites in the abdomen but the largest amount of fluid is located in the upper abdominal midline and around the liver. Electronically Signed   By: Richarda OverlieAdam  Henn M.D.   On: 01/05/2019 16:49    Assessment/Plan Active Problems:   Acute on chronic respiratory failure with hypoxia (HCC)   Acute renal failure due to tubular necrosis (HCC)   Strangulated inguinal hernia   Necrotizing pneumonia (HCC)   Paroxysmal atrial fibrillation with rapid ventricular response (HCC)   ARDS (adult respiratory distress syndrome) (HCC)   Severe sepsis (HCC)   1. Acute on chronic respiratory failure with hypoxia failing T collar attempts will reassess again today 2. Acute renal failure we will continue to monitor labs are stabilized 3. Necrotizing pneumonia follow-up radiologically.  CT recommended in 3 to 6 months. 4. Paroxysmal atrial fibrillation rate is controlled at this time 5.  ARDS clinically improving we will continue to monitor 6. Severe sepsis right now hemodynamics are stable we will continue to monitor   I have personally seen and evaluated the patient, evaluated  laboratory and imaging results, formulated the assessment and plan and placed orders. The Patient requires high complexity decision making for assessment and support.  Case was discussed on Rounds with the Respiratory Therapy Staff  Yevonne Pax, MD [facility] Pulmonary Critical Care Medicine Sleep Medicine

## 2019-01-07 DIAGNOSIS — J9621 Acute and chronic respiratory failure with hypoxia: Secondary | ICD-10-CM | POA: Diagnosis not present

## 2019-01-07 DIAGNOSIS — N170 Acute kidney failure with tubular necrosis: Secondary | ICD-10-CM | POA: Diagnosis not present

## 2019-01-07 DIAGNOSIS — J80 Acute respiratory distress syndrome: Secondary | ICD-10-CM | POA: Diagnosis not present

## 2019-01-07 DIAGNOSIS — J850 Gangrene and necrosis of lung: Secondary | ICD-10-CM | POA: Diagnosis not present

## 2019-01-07 DIAGNOSIS — J9811 Atelectasis: Secondary | ICD-10-CM | POA: Diagnosis not present

## 2019-01-07 NOTE — Procedures (Signed)
Date: 01/07/2019,  MRN# [id number]    Procedure Note: Fiberoptic Bronchoscopy   PROCEDURE DATE: 01/07/2019     NAME:  Benjamin [name]   DOB:July 27, 1956   MRN: [id number] LOC:  [id number]      Indications/Preliminary Diagnosis: Atelectasis  Consent: (Place X beside choice/s below)  The benefits, risks and possible complications of the procedure were        explained to:  ___ patient  __X_ patient's family  ___ other:___________  who verbalized understanding and gave:  ___ verbal  ___ written  ___ verbal and written  __X_ telephone  ___ other:________ consent.      Unable to obtain consent; procedure performed on emergent basis.     Other:      PRESEDATION ASSESSMENT: History and Physical has been performed. Patient meds and allergies have been reviewed. Presedation airway examination has been performed and documented. Baseline vital signs, sedation score, oxygenation status, and cardiac rhythm were reviewed. Patient was deemed to be in satisfactory condition to undergo the procedure.  PREMEDICATIONS:   Sedative/Narcotic Amt Dose   Versed 4 mg   Fentanyl 50 mcg  Diprivan  mg         Insertion Route (Place X beside choice below)   Nasal   Oral   Endotracheal Tube  X Tracheostomy   INTRAPROCEDURE MEDICATIONS:  Sedative/Narcotic Amt Dose   Versed  mg   Fentanyl  mcg  Diprivan  mg       Medication Amt Dose  Medication Amt Dose  Xylocaine 2%  cc  Epinephrine 1:10,000 sol  cc  Xylocaine 4%  cc  Cocaine  cc   TECHNICAL PROCEDURES: (Place X beside choice below)   Procedures  Description  X  None     Electrocautery     Cryotherapy     Balloon Dilatation     Bronchography     Stent Placement     Therapeutic Aspiration     Laser/Argon Plasma            SPECIMENS (Sites): (Place X beside choice below)  Specimens Description   No Specimens Obtained     Washings   X Lavage  left lower lobe bronchoalveolar lavage   Biopsies    Fine Needle Aspirates    Brushings    Sputum    FINDINGS:  ESTIMATED BLOOD LOSS: none  COMPLICATIONS/RESOLUTION: none  PROCEDURE DETAILS: Timeout performed and correct patient, name, & ID confirmed. Following prep per Pulmonary policy, appropriate sedation was administered.  Airway exam proceeded with findings, technical procedures, and specimen collection as noted below. At the end of exam the scope was withdrawn without incident. Impression and Plan as noted below.    Procedure Note: After above performed patient was prepared in the usual manner patient was adequately sedated with fentanyl and Versed.  Fiberoptic scope was inserted down through the tracheostomy into the lungs.  The carina was nice and sharp.  The right lung was evaluated found to be free of any endobronchial disease and had minimal to no secretions noted.  Next the fiberoptic scope was inserted into the left lung and at that time the left lung revealed mucous plug in the left mainstem bronchus.  The mucus was quite thick tenacious and difficult to suction.  We had to instill Mucomyst and also lavaged with saline.  The fiberoptic scope was inserted into the left lower lobe and bronchoalveolar lavage was performed.  All other lobes were also evaluated and suctioned and cleared.  Underlying mucosa  appeared to be clean    IMPRESSION:POST-PROCEDURE DX: Atelectasis secondary to mucous plugging   RECOMMENDATION/PLAN: Aggressive pulmonary toilet consider Mucomyst as necessary  I have personally performed the procedure as noted above     Allyne Gee, MD [facility] Pulmonary Critical Care Medicine

## 2019-01-07 NOTE — Progress Notes (Addendum)
Pulmonary Critical Care Medicine [facility] [place]   PULMONARY CRITICAL CARE SERVICE  PROGRESS NOTE  Date of Service: 01/07/2019  Benjamin [name]  [id number]:[id number]  DOB: 08-Sep-1955   DOA: 12/14/2018  Referring Physician: Carron Curie, MD  HPI: Benjamin [name] is a 63 y.o. male seen for follow up of Acute on Chronic Respiratory Failure.  Patient is currently resting on the vent will be receiving a bronc today.  Overnight his PEEP was increased and is up to 50% FiO2.  Medications: Reviewed on Rounds  Physical Exam:  Vitals: Pulse 66 respiration 10 BP 103/58 O2 sat 97% temp 94.3  Ventilator Settings ventilator mode AC VC FiO2 50% tidal volume 450 PEEP of 8  . General: Comfortable at this time . Eyes: Grossly normal lids, irises & conjunctiva . ENT: grossly tongue is normal . Neck: no obvious mass . Cardiovascular: S1 S2 normal no gallop . Respiratory: Coarse breath sounds . Abdomen: soft . Skin: no rash seen on limited exam . Musculoskeletal: not rigid . Psychiatric:unable to assess . Neurologic: no seizure no involuntary movements         Lab Data:   Basic Metabolic Panel: Recent Labs  Lab 01/02/19 0537 01/03/19 0546 01/05/19 0528 01/06/19 0454  NA 149*  --  150* 145  K 3.0* 3.2* 3.1* 3.7  CL 113*  --  115* 114*  CO2 23  --  26 26  GLUCOSE 91  --  97 104*  BUN 58*  --  41* 40*  CREATININE 0.96  --  0.68 0.71  CALCIUM 8.2*  --  8.2* 8.2*    ABG: No results for input(s): PHART, PCO2ART, PO2ART, HCO3, O2SAT in the last 168 hours.  Liver Function Tests: No results for input(s): AST, ALT, ALKPHOS, BILITOT, PROT, ALBUMIN in the last 168 hours. No results for input(s): LIPASE, AMYLASE in the last 168 hours. No results for input(s): AMMONIA in the last 168 hours.  CBC: Recent Labs  Lab 01/02/19 0537 01/03/19 0546 01/03/19 1100 01/05/19 0528 01/06/19 0454  WBC 9.1 7.9  --  6.5 6.6  HGB 6.7* 7.6*  --  7.1* 7.3*  HCT 22.6* 24.8* 21.7* 23.5* 23.9*   MCV 102.7* 100.4*  --  101.7* 100.8*  PLT 159 175  --  193 194    Cardiac Enzymes: No results for input(s): CKTOTAL, CKMB, CKMBINDEX, TROPONINI in the last 168 hours.  BNP (last 3 results) No results for input(s): BNP in the last 8760 hours.  ProBNP (last 3 results) No results for input(s): PROBNP in the last 8760 hours.  Radiological Exams: Ct Chest Wo Contrast  Result Date: 01/06/2019 CLINICAL DATA:  Inpatient. Clinical concern for interstitial lung disease. Necrotizing pneumonia. Acute on chronic respiratory failure with hypoxia. EXAM: CT CHEST WITHOUT CONTRAST TECHNIQUE: Multidetector CT imaging of the chest was performed following the standard protocol without IV contrast. COMPARISON:  12/20/2018 chest CT.  12/31/2018 chest radiograph. FINDINGS: Cardiovascular: Normal heart size. No significant pericardial effusion/thickening. Left anterior descending coronary atherosclerosis. Atherosclerotic nonaneurysmal thoracic aorta. Normal caliber pulmonary arteries. Mediastinum/Nodes: No discrete thyroid nodules. Unremarkable esophagus. No axillary adenopathy. Stable mildly enlarged 1.2 cm subcarinal node (series 3/image 82). No additional pathologically enlarged mediastinal nodes. Stable mildly enlarged 1.1 cm right hilar node (series 3/image 86). No discrete left hilar adenopathy on this noncontrast scan. Lungs/Pleura: No pneumothorax. Small right and trace left dependent pleural effusions, stable. Tracheostomy tube terminates in the trachea just below the level of the thoracic inlet. Severe centrilobular and paraseptal  emphysema with diffuse bronchial wall thickening. Left lower lobe airways remain occluded by mucoid material. Improved aeration of the left mainstem bronchus and left upper lobe airways, with residual mucoid material in the left mainstem bronchus. Near complete left lower lobe atelectasis with slightly improved left lower lobe aeration in the superior segment. Patchy nodular foci of  consolidation throughout the dependent lungs bilaterally, predominantly centrilobular in distribution, stable to decreased in size, some with new central cystic change. For example a posterior right upper lobe 1.9 cm nodule demonstrates new central cystic change (series 4/image 30), previously 1.9 cm, stable in size. Posterior left upper lobe 1.7 cm solid nodule (series 4/image 31), decreased from 2.1 cm. Moderate dependent right lower lobe atelectasis, stable. No new significant pulmonary nodules. No significant regions of subpleural reticulation or frank honeycombing. Upper abdomen: Small volume upper abdominal ascites. Enteric tube terminates in the body of the stomach. Musculoskeletal: No aggressive appearing focal osseous lesions. Mild thoracic spondylosis. IMPRESSION: 1. Severe centrilobular and paraseptal emphysema with diffuse bronchial wall thickening, suggesting COPD. Well-positioned tracheostomy tube. 2. Extensive patchy generally centrilobular nodular foci of consolidation throughout the dependent lungs, stable to mildly decreased since 12/20/2018 chest CT, favoring multilobar pneumonia/aspiration. New central cystic change within some of pulmonary nodules, which could represent cavitary infection versus mucoid clearance within cystic foci of bronchiectasis. Suggest follow-up chest CT in 3-6 months in this high risk patient to exclude underlying neoplasm, which is less favored. 3. Persistent occlusion of the left lower lobe airways by mucoid impaction. Near complete left lower lobe atelectasis with some improved aeration in the superior segment of the left lower lobe. 4. Small left and trace right dependent pleural effusions, stable. 5. No evidence of underlying interstitial lung disease. 6. One vessel coronary atherosclerosis. 7. Nonspecific mild mediastinal and right hilar adenopathy, stable, more likely reactive, which can also be reassessed on follow-up chest CT in 3-6 months. Aortic Atherosclerosis  (ICD10-I70.0) and Emphysema (ICD10-J43.9). Electronically Signed   By: Delbert PhenixJason A Poff M.D.   On: 01/06/2019 08:31    Assessment/Plan Active Problems:   Acute on chronic respiratory failure with hypoxia (HCC)   Acute renal failure due to tubular necrosis (HCC)   Strangulated inguinal hernia   Necrotizing pneumonia (HCC)   Paroxysmal atrial fibrillation with rapid ventricular response (HCC)   ARDS (adult respiratory distress syndrome) (HCC)   Severe sepsis (HCC)   1. Acute on chronic respiratory failure with hypoxia patient is on full support at this time we receiving bronchoscopy today due to of failing T collar attempts.  Continue supportive measures and pulmonary toilet 2. Acute renal failure continue to monitor labs 3. Necrotizing pneumonia follow-up with x-ray CT would be recommended in 3 to 6 months 4. Paroxysmal atrial fibrillation rate and 5. ARDS clinically improving continue to follow 6. Severe sepsis hemodynamically stable continue to monitor   I have personally seen and evaluated the patient, evaluated laboratory and imaging results, formulated the assessment and plan and placed orders. The Patient requires high complexity decision making for assessment and support.  Case was discussed on Rounds with the Respiratory Therapy Staff  Yevonne PaxSaadat A Khan, MD [facility] Pulmonary Critical Care Medicine Sleep Medicine

## 2019-01-08 DIAGNOSIS — J850 Gangrene and necrosis of lung: Secondary | ICD-10-CM | POA: Diagnosis not present

## 2019-01-08 DIAGNOSIS — J80 Acute respiratory distress syndrome: Secondary | ICD-10-CM | POA: Diagnosis not present

## 2019-01-08 DIAGNOSIS — J9621 Acute and chronic respiratory failure with hypoxia: Secondary | ICD-10-CM | POA: Diagnosis not present

## 2019-01-08 DIAGNOSIS — N170 Acute kidney failure with tubular necrosis: Secondary | ICD-10-CM | POA: Diagnosis not present

## 2019-01-08 LAB — ACID FAST SMEAR (AFB, MYCOBACTERIA): Acid Fast Smear: NEGATIVE

## 2019-01-08 NOTE — Progress Notes (Addendum)
Pulmonary Critical Care Medicine [facility] [place]   PULMONARY CRITICAL CARE SERVICE  PROGRESS NOTE  Date of Service: 01/08/2019  Benjamin [name]  LDJ:[id number]  DOB: 27-Apr-1956   DOA: 12/14/2018  Referring Physician: Carron Curie, MD  HPI: Benjamin [name] is a 63 y.o. male seen for follow up of Acute on Chronic Respiratory Failure.  Patient continues on aerosol trach collar as tolerated currently using 50% FiO2.  Medications: Reviewed on Rounds  Physical Exam:  Vitals: Pulse 78 respirations 17 BP 120/62 O2 sat 91% temp 96.0  Ventilator Settings 50% ATC  . General: Comfortable at this time . Eyes: Grossly normal lids, irises & conjunctiva . ENT: grossly tongue is normal . Neck: no obvious mass . Cardiovascular: S1 S2 normal no gallop . Respiratory: Coarse breath sounds . Abdomen: soft . Skin: no rash seen on limited exam . Musculoskeletal: not rigid . Psychiatric:unable to assess . Neurologic: no seizure no involuntary movements         Lab Data:   Basic Metabolic Panel: Recent Labs  Lab 01/02/19 0537 01/03/19 0546 01/05/19 0528 01/06/19 0454  NA 149*  --  150* 145  K 3.0* 3.2* 3.1* 3.7  CL 113*  --  115* 114*  CO2 23  --  26 26  GLUCOSE 91  --  97 104*  BUN 58*  --  41* 40*  CREATININE 0.96  --  0.68 0.71  CALCIUM 8.2*  --  8.2* 8.2*    ABG: No results for input(s): PHART, PCO2ART, PO2ART, HCO3, O2SAT in the last 168 hours.  Liver Function Tests: No results for input(s): AST, ALT, ALKPHOS, BILITOT, PROT, ALBUMIN in the last 168 hours. No results for input(s): LIPASE, AMYLASE in the last 168 hours. No results for input(s): AMMONIA in the last 168 hours.  CBC: Recent Labs  Lab 01/02/19 0537 01/03/19 0546 01/03/19 1100 01/05/19 0528 01/06/19 0454  WBC 9.1 7.9  --  6.5 6.6  HGB 6.7* 7.6*  --  7.1* 7.3*  HCT 22.6* 24.8* 21.7* 23.5* 23.9*  MCV 102.7* 100.4*  --  101.7* 100.8*  PLT 159 175  --  193 194    Cardiac Enzymes: No results  for input(s): CKTOTAL, CKMB, CKMBINDEX, TROPONINI in the last 168 hours.  BNP (last 3 results) No results for input(s): BNP in the last 8760 hours.  ProBNP (last 3 results) No results for input(s): PROBNP in the last 8760 hours.  Radiological Exams: No results found.  Assessment/Plan Active Problems:   Acute on chronic respiratory failure with hypoxia (HCC)   Acute renal failure due to tubular necrosis (HCC)   Strangulated inguinal hernia   Necrotizing pneumonia (HCC)   Paroxysmal atrial fibrillation with rapid ventricular response (HCC)   ARDS (adult respiratory distress syndrome) (HCC)   Severe sepsis (HCC)   1. Acute on chronic respiratory failure with hypoxia patient is on aerosol trach collar 50% FiO2 doing well at this time.  Continue supportive measures and pulmonary toilet. 2. Acute renal failure continue to monitor labs 3. Necrotizing pneumonia follow-up with x-ray CT would be recommended in 3 to 6 months 4. Paroxysmal atrial fibrillation rate and 5. ARDS clinically improving continue to follow 6. Severe sepsis hemodynamically stable continue to monitor   I have personally seen and evaluated the patient, evaluated laboratory and imaging results, formulated the assessment and plan and placed orders. The Patient requires high complexity decision making for assessment and support.  Case was discussed on Rounds with the Respiratory Therapy  Staff  Allyne Gee, MD [facility] Pulmonary Critical Care Medicine Sleep Medicine

## 2019-01-09 ENCOUNTER — Other Ambulatory Visit (HOSPITAL_COMMUNITY): Payer: Self-pay

## 2019-01-09 DIAGNOSIS — J850 Gangrene and necrosis of lung: Secondary | ICD-10-CM | POA: Diagnosis not present

## 2019-01-09 DIAGNOSIS — N170 Acute kidney failure with tubular necrosis: Secondary | ICD-10-CM | POA: Diagnosis not present

## 2019-01-09 DIAGNOSIS — J80 Acute respiratory distress syndrome: Secondary | ICD-10-CM | POA: Diagnosis not present

## 2019-01-09 DIAGNOSIS — J9621 Acute and chronic respiratory failure with hypoxia: Secondary | ICD-10-CM | POA: Diagnosis not present

## 2019-01-09 NOTE — Progress Notes (Signed)
Pulmonary Critical Care Medicine [facility] [place]   PULMONARY CRITICAL CARE SERVICE  PROGRESS NOTE  Date of Service: 01/09/2019  Benjamin [name]  FOY:[id number]  DOB: 02-09-1956   DOA: 12/14/2018  Referring Physician: Carron Curie, MD  HPI: Benjamin [name] is a 63 y.o. male seen for follow up of Acute on Chronic Respiratory Failure.  Patient currently is on T collar has been on 40% FiO2 off the ventilator for more than 24 hours since the bronchoscopy  Medications: Reviewed on Rounds  Physical Exam:  Vitals: Temperature 96.4 pulse 69 respiratory rate 12 blood pressure 139/63 saturations 96%  Ventilator Settings off the ventilator on T collar  . General: Comfortable at this time . Eyes: Grossly normal lids, irises & conjunctiva . ENT: grossly tongue is normal . Neck: no obvious mass . Cardiovascular: S1 S2 normal no gallop . Respiratory: No rhonchi or rales are noted . Abdomen: soft . Skin: no rash seen on limited exam . Musculoskeletal: not rigid . Psychiatric:unable to assess . Neurologic: no seizure no involuntary movements         Lab Data:   Basic Metabolic Panel: Recent Labs  Lab 01/03/19 0546 01/05/19 0528 01/06/19 0454  NA  --  150* 145  K 3.2* 3.1* 3.7  CL  --  115* 114*  CO2  --  26 26  GLUCOSE  --  97 104*  BUN  --  41* 40*  CREATININE  --  0.68 0.71  CALCIUM  --  8.2* 8.2*    ABG: No results for input(s): PHART, PCO2ART, PO2ART, HCO3, O2SAT in the last 168 hours.  Liver Function Tests: No results for input(s): AST, ALT, ALKPHOS, BILITOT, PROT, ALBUMIN in the last 168 hours. No results for input(s): LIPASE, AMYLASE in the last 168 hours. No results for input(s): AMMONIA in the last 168 hours.  CBC: Recent Labs  Lab 01/03/19 0546 01/03/19 1100 01/05/19 0528 01/06/19 0454  WBC 7.9  --  6.5 6.6  HGB 7.6*  --  7.1* 7.3*  HCT 24.8* 21.7* 23.5* 23.9*  MCV 100.4*  --  101.7* 100.8*  PLT 175  --  193 194    Cardiac Enzymes: No  results for input(s): CKTOTAL, CKMB, CKMBINDEX, TROPONINI in the last 168 hours.  BNP (last 3 results) No results for input(s): BNP in the last 8760 hours.  ProBNP (last 3 results) No results for input(s): PROBNP in the last 8760 hours.  Radiological Exams: No results found.  Assessment/Plan Active Problems:   Acute on chronic respiratory failure with hypoxia (HCC)   Acute renal failure due to tubular necrosis (HCC)   Strangulated inguinal hernia   Necrotizing pneumonia (HCC)   Paroxysmal atrial fibrillation with rapid ventricular response (HCC)   ARDS (adult respiratory distress syndrome) (HCC)   Severe sepsis (HCC)   1. Acute on chronic respiratory failure with hypoxia we will continue with T collar trials currently is on 40% FiO2 titrate down as tolerated follow-up chest x-ray also ordered 2. Acute renal failure we will monitor labs 3. Strangulated hernia status post repair 4. Necrotizing pneumonia clinically improving follow-up chest x-ray ordered 5. Paroxysmal atrial fibrillation rate is currently controlled 6. ARDS clinically resolved 7. Severe sepsis resolved   I have personally seen and evaluated the patient, evaluated laboratory and imaging results, formulated the assessment and plan and placed orders. The Patient requires high complexity decision making for assessment and support.  Case was discussed on Rounds with the Respiratory Therapy Staff  [facility]  Richardson Dopp, MD [facility] Pulmonary Critical Care Medicine Sleep Medicine

## 2019-01-10 DIAGNOSIS — J850 Gangrene and necrosis of lung: Secondary | ICD-10-CM | POA: Diagnosis not present

## 2019-01-10 DIAGNOSIS — J9621 Acute and chronic respiratory failure with hypoxia: Secondary | ICD-10-CM | POA: Diagnosis not present

## 2019-01-10 DIAGNOSIS — N170 Acute kidney failure with tubular necrosis: Secondary | ICD-10-CM | POA: Diagnosis not present

## 2019-01-10 DIAGNOSIS — J80 Acute respiratory distress syndrome: Secondary | ICD-10-CM | POA: Diagnosis not present

## 2019-01-10 NOTE — Progress Notes (Addendum)
Pulmonary Critical Care Medicine [facility] [place]   PULMONARY CRITICAL CARE SERVICE  PROGRESS NOTE  Date of Service: 01/10/2019  Benjamin [name]  QTM:[id number]  DOB: 27-May-1956   DOA: 12/14/2018  Referring Physician: Carron Curie, MD  HPI: Benjamin [name] is a 63 y.o. male seen for follow up of Acute on Chronic Respiratory Failure.  Patient is able to do over 50 hours on aerosol trach collar 40% before speech and physical therapy came in to work with him back to back and he was satting in the low 80s.  Respiratory tried multiple ways to bring up his saturation but ultimately had to put him back on full support.  He is currently requiring 50% FiO2.  Medications: Reviewed on Rounds  Physical Exam:  Vitals: Pulse 7 6 respirations 12 BP 97/51 O2 sat 96% temp 97.0  Ventilator Settings ventilator mode AC VC rate of 26 tidal line 450 PEEP 5 FiO2 50%  . General: Comfortable at this time . Eyes: Grossly normal lids, irises & conjunctiva . ENT: grossly tongue is normal . Neck: no obvious mass . Cardiovascular: S1 S2 normal no gallop . Respiratory: No rales or rhonchi noted . Abdomen: soft . Skin: no rash seen on limited exam . Musculoskeletal: not rigid . Psychiatric:unable to assess . Neurologic: no seizure no involuntary movements         Lab Data:   Basic Metabolic Panel: Recent Labs  Lab 01/05/19 0528 01/06/19 0454  NA 150* 145  K 3.1* 3.7  CL 115* 114*  CO2 26 26  GLUCOSE 97 104*  BUN 41* 40*  CREATININE 0.68 0.71  CALCIUM 8.2* 8.2*    ABG: No results for input(s): PHART, PCO2ART, PO2ART, HCO3, O2SAT in the last 168 hours.  Liver Function Tests: No results for input(s): AST, ALT, ALKPHOS, BILITOT, PROT, ALBUMIN in the last 168 hours. No results for input(s): LIPASE, AMYLASE in the last 168 hours. No results for input(s): AMMONIA in the last 168 hours.  CBC: Recent Labs  Lab 01/05/19 0528 01/06/19 0454  WBC 6.5 6.6  HGB 7.1* 7.3*  HCT 23.5* 23.9*   MCV 101.7* 100.8*  PLT 193 194    Cardiac Enzymes: No results for input(s): CKTOTAL, CKMB, CKMBINDEX, TROPONINI in the last 168 hours.  BNP (last 3 results) No results for input(s): BNP in the last 8760 hours.  ProBNP (last 3 results) No results for input(s): PROBNP in the last 8760 hours.  Radiological Exams: Dg Chest Port 1 View  Result Date: 01/09/2019 CLINICAL DATA:  Follow-up, hypoxia EXAM: PORTABLE CHEST 1 VIEW COMPARISON:  CT 01/05/2019, radiograph 12/31/2018, 12/23/2018 FINDINGS: Tracheostomy tube remains in place with the tip about 5.8 cm superior to the carina. Small bilateral pleural effusions, increased on the right side. Increased hazy airspace disease at both lung bases. Enlarged cardiomediastinal silhouette with vascular congestion. No pneumothorax. IMPRESSION: 1. Similar appearance of small left pleural effusion. Continued consolidation at the left lung base. Slight increased hazy left lower lung opacity, may be due to layering effusion. 2. Increased small right pleural effusion and right basilar airspace disease. 3. Cardiomegaly with vascular congestion. Electronically Signed   By: Jasmine Pang M.D.   On: 01/09/2019 21:28    Assessment/Plan Active Problems:   Acute on chronic respiratory failure with hypoxia (HCC)   Acute renal failure due to tubular necrosis (HCC)   Strangulated inguinal hernia   Necrotizing pneumonia (HCC)   Paroxysmal atrial fibrillation with rapid ventricular response (HCC)   ARDS (adult  respiratory distress syndrome) (HCC)   Severe sepsis (HCC)   1. Acute on chronic respiratory failure with hypoxia we will continue with full support on the ventilator at this time.  We will begin weaning to aerosol trach collar again tomorrow after resting.  Continue pulmonary toilet and supportive measures. 2. Acute renal failure we will monitor labs 3. Strangulated hernia status post repair 4. Necrotizing pneumonia clinically improving follow-up chest x-ray  ordered 5. Paroxysmal atrial fibrillation rate is currently controlled 6. ARDS clinically resolved 7. Severe sepsis resolved   I have personally seen and evaluated the patient, evaluated laboratory and imaging results, formulated the assessment and plan and placed orders. The Patient requires high complexity decision making for assessment and support.  Case was discussed on Rounds with the Respiratory Therapy Staff  Yevonne PaxSaadat A Khan, MD [facility] Pulmonary Critical Care Medicine Sleep Medicine

## 2019-01-11 DIAGNOSIS — J80 Acute respiratory distress syndrome: Secondary | ICD-10-CM | POA: Diagnosis not present

## 2019-01-11 DIAGNOSIS — N170 Acute kidney failure with tubular necrosis: Secondary | ICD-10-CM | POA: Diagnosis not present

## 2019-01-11 DIAGNOSIS — J9621 Acute and chronic respiratory failure with hypoxia: Secondary | ICD-10-CM | POA: Diagnosis not present

## 2019-01-11 DIAGNOSIS — J850 Gangrene and necrosis of lung: Secondary | ICD-10-CM | POA: Diagnosis not present

## 2019-01-11 LAB — BASIC METABOLIC PANEL
Anion gap: 8 (ref 5–15)
BUN: 31 mg/dL — ABNORMAL HIGH (ref 8–23)
CO2: 29 mmol/L (ref 22–32)
Calcium: 8.1 mg/dL — ABNORMAL LOW (ref 8.9–10.3)
Chloride: 99 mmol/L (ref 98–111)
Creatinine, Ser: 0.51 mg/dL — ABNORMAL LOW (ref 0.61–1.24)
GFR calc Af Amer: >60 mL/min (ref >60)
GFR calc non Af Amer: >60 mL/min (ref >60)
Glucose, Bld: 135 mg/dL — ABNORMAL HIGH (ref 70–99)
Potassium: 4.5 mmol/L (ref 3.5–5.1)
Sodium: 136 mmol/L (ref 135–145)

## 2019-01-11 LAB — CULTURE, RESPIRATORY W GRAM STAIN

## 2019-01-11 LAB — CBC
HCT: 22.9 % — ABNORMAL LOW (ref 39.0–52.0)
Hemoglobin: 7.3 g/dL — ABNORMAL LOW (ref 13.0–17.0)
MCH: 31.6 pg (ref 26.0–34.0)
MCHC: 31.9 g/dL (ref 30.0–36.0)
MCV: 99.1 fL (ref 80.0–100.0)
Platelets: 321 10*3/uL (ref 150–400)
RBC: 2.31 MIL/uL — ABNORMAL LOW (ref 4.22–5.81)
RDW: 18.2 % — ABNORMAL HIGH (ref 11.5–15.5)
WBC: 8 10*3/uL (ref 4.0–10.5)
nRBC: 0 % (ref 0.0–0.2)

## 2019-01-11 NOTE — Progress Notes (Addendum)
Pulmonary Critical Care Medicine [facility] [place]   PULMONARY CRITICAL CARE SERVICE  PROGRESS NOTE  Date of Service: 01/11/2019  Benjamin [name]  LKG:[id number]  DOB: October 31, 1955   DOA: 12/14/2018  Referring Physician: Carron Curie, MD  HPI: Benjamin [name] is a 63 y.o. male seen for follow up of Acute on Chronic Respiratory Failure.  Patient was placed back on aerosol trach collar 50% FiO2 this morning.  Has orders currently for as tolerated.  Satting well distress or fever.  Medications: Reviewed on Rounds  Physical Exam:  Vitals: Pulse 86 respirate 17 BP 122/64 O2 sat 95% temp 96.5  Ventilator Settings ventilator mode AC PC rate of 22 ventilatory pressure 18 PEEP 5 FiO2 50%  . General: Comfortable at this time . Eyes: Grossly normal lids, irises & conjunctiva . ENT: grossly tongue is normal . Neck: no obvious mass . Cardiovascular: S1 S2 normal no gallop . Respiratory: No rales or rhonchi noted . Abdomen: soft . Skin: no rash seen on limited exam . Musculoskeletal: not rigid . Psychiatric:unable to assess . Neurologic: no seizure no involuntary movements         Lab Data:   Basic Metabolic Panel: Recent Labs  Lab 01/05/19 0528 01/06/19 0454 01/11/19 0746  NA 150* 145 136  K 3.1* 3.7 4.5  CL 115* 114* 99  CO2 26 26 29   GLUCOSE 97 104* 135*  BUN 41* 40* 31*  CREATININE 0.68 0.71 0.51*  CALCIUM 8.2* 8.2* 8.1*    ABG: No results for input(s): PHART, PCO2ART, PO2ART, HCO3, O2SAT in the last 168 hours.  Liver Function Tests: No results for input(s): AST, ALT, ALKPHOS, BILITOT, PROT, ALBUMIN in the last 168 hours. No results for input(s): LIPASE, AMYLASE in the last 168 hours. No results for input(s): AMMONIA in the last 168 hours.  CBC: Recent Labs  Lab 01/05/19 0528 01/06/19 0454 01/11/19 0746  WBC 6.5 6.6 8.0  HGB 7.1* 7.3* 7.3*  HCT 23.5* 23.9* 22.9*  MCV 101.7* 100.8* 99.1  PLT 193 194 321    Cardiac Enzymes: No results for input(s):  CKTOTAL, CKMB, CKMBINDEX, TROPONINI in the last 168 hours.  BNP (last 3 results) No results for input(s): BNP in the last 8760 hours.  ProBNP (last 3 results) No results for input(s): PROBNP in the last 8760 hours.  Radiological Exams: Dg Chest Port 1 View  Result Date: 01/09/2019 CLINICAL DATA:  Follow-up, hypoxia EXAM: PORTABLE CHEST 1 VIEW COMPARISON:  CT 01/05/2019, radiograph 12/31/2018, 12/23/2018 FINDINGS: Tracheostomy tube remains in place with the tip about 5.8 cm superior to the carina. Small bilateral pleural effusions, increased on the right side. Increased hazy airspace disease at both lung bases. Enlarged cardiomediastinal silhouette with vascular congestion. No pneumothorax. IMPRESSION: 1. Similar appearance of small left pleural effusion. Continued consolidation at the left lung base. Slight increased hazy left lower lung opacity, may be due to layering effusion. 2. Increased small right pleural effusion and right basilar airspace disease. 3. Cardiomegaly with vascular congestion. Electronically Signed   By: Jasmine Pang M.D.   On: 01/09/2019 21:28    Assessment/Plan Active Problems:   Acute on chronic respiratory failure with hypoxia (HCC)   Acute renal failure due to tubular necrosis (HCC)   Strangulated inguinal hernia   Necrotizing pneumonia (HCC)   Paroxysmal atrial fibrillation with rapid ventricular response (HCC)   ARDS (adult respiratory distress syndrome) (HCC)   Severe sepsis (HCC)   1. Acute on chronic respiratory failure with hypoxia patient back  on as tolerated aerosol trach collar 50% FiO2. Continue pulmonary toilet and supportive measures. 2. Acute renal failure we will monitor labs 3. Strangulated hernia status post repair 4. Necrotizing pneumonia clinically improving follow-up chest x-ray ordered 5. Paroxysmal atrial fibrillation rate is currently controlled 6. ARDS clinically resolved 7. Severe sepsis resolved   I have personally seen and  evaluated the patient, evaluated laboratory and imaging results, formulated the assessment and plan and placed orders. The Patient requires high complexity decision making for assessment and support.  Case was discussed on Rounds with the Respiratory Therapy Staff  Yevonne PaxSaadat A Idrissa Beville, MD [facility] Pulmonary Critical Care Medicine Sleep Medicine The last time the note before they attached to the visit last night you note so of oxygen seen you acknowledge that they already had signed you move all the left leg.  So

## 2019-01-12 DIAGNOSIS — J850 Gangrene and necrosis of lung: Secondary | ICD-10-CM | POA: Diagnosis not present

## 2019-01-12 DIAGNOSIS — N170 Acute kidney failure with tubular necrosis: Secondary | ICD-10-CM | POA: Diagnosis not present

## 2019-01-12 DIAGNOSIS — J80 Acute respiratory distress syndrome: Secondary | ICD-10-CM | POA: Diagnosis not present

## 2019-01-12 DIAGNOSIS — J9621 Acute and chronic respiratory failure with hypoxia: Secondary | ICD-10-CM | POA: Diagnosis not present

## 2019-01-12 NOTE — Progress Notes (Addendum)
Pulmonary Critical Care Medicine [facility] [place]   PULMONARY CRITICAL CARE SERVICE  PROGRESS NOTE  Date of Service: 01/12/2019  DON SAUVAGEAU  ZOX:[id number]  DOB: 1956/01/26   DOA: 12/14/2018  Referring Physician: Carron Curie, MD  HPI: Benjamin [name] is a 63 y.o. male seen for follow up of Acute on Chronic Respiratory Failure.  Patient able to tolerate 12 hours on aerosol trach collar yesterday but then desatted.  Patient remains on full support on the ventilator at this time currently with a rate of 26 and requiring 50% FiO2.  Medications: Reviewed on Rounds  Physical Exam:  Vitals: Pulse 72 respirations 28 BP 111/59 O2 sat 94% temp 97.6  Ventilator Settings ventilator mode AC VC rate of 26 tidal volume 450 PEEP of 8 FiO2 50%  . General: Comfortable at this time . Eyes: Grossly normal lids, irises & conjunctiva . ENT: grossly tongue is normal . Neck: no obvious mass . Cardiovascular: S1 S2 normal no gallop . Respiratory: No rales or rhonchi noted . Abdomen: soft . Skin: no rash seen on limited exam . Musculoskeletal: not rigid . Psychiatric:unable to assess . Neurologic: no seizure no involuntary movements         Lab Data:   Basic Metabolic Panel: Recent Labs  Lab 01/06/19 0454 01/11/19 0746  NA 145 136  K 3.7 4.5  CL 114* 99  CO2 26 29  GLUCOSE 104* 135*  BUN 40* 31*  CREATININE 0.71 0.51*  CALCIUM 8.2* 8.1*    ABG: No results for input(s): PHART, PCO2ART, PO2ART, HCO3, O2SAT in the last 168 hours.  Liver Function Tests: No results for input(s): AST, ALT, ALKPHOS, BILITOT, PROT, ALBUMIN in the last 168 hours. No results for input(s): LIPASE, AMYLASE in the last 168 hours. No results for input(s): AMMONIA in the last 168 hours.  CBC: Recent Labs  Lab 01/06/19 0454 01/11/19 0746  WBC 6.6 8.0  HGB 7.3* 7.3*  HCT 23.9* 22.9*  MCV 100.8* 99.1  PLT 194 321    Cardiac Enzymes: No results for input(s): CKTOTAL, CKMB, CKMBINDEX,  TROPONINI in the last 168 hours.  BNP (last 3 results) No results for input(s): BNP in the last 8760 hours.  ProBNP (last 3 results) No results for input(s): PROBNP in the last 8760 hours.  Radiological Exams: No results found.  Assessment/Plan Active Problems:   Acute on chronic respiratory failure with hypoxia (HCC)   Acute renal failure due to tubular necrosis (HCC)   Strangulated inguinal hernia   Necrotizing pneumonia (HCC)   Paroxysmal atrial fibrillation with rapid ventricular response (HCC)   ARDS (adult respiratory distress syndrome) (HCC)   Severe sepsis (HCC)   1. Acute on chronic respiratory failure with hypoxia  patient is back on full support on the ventilator currently on 50% FiO2.  Continue supportive measures and pulmonary toilet. 2. Acute renal failure we will monitor labs 3. Strangulated hernia status post repair 4. Necrotizing pneumonia clinically improving follow-up chest x-ray ordered 5. Paroxysmal atrial fibrillation rate is currently controlled 6. ARDS clinically resolved 7. Severe sepsis resolved   I have personally seen and evaluated the patient, evaluated laboratory and imaging results, formulated the assessment and plan and placed orders. The Patient requires high complexity decision making for assessment and support.  Case was discussed on Rounds with the Respiratory Therapy Staff  Yevonne Pax, MD [facility] Pulmonary Critical Care Medicine Sleep Medicine

## 2019-01-13 ENCOUNTER — Other Ambulatory Visit (HOSPITAL_COMMUNITY): Payer: Self-pay

## 2019-01-13 DIAGNOSIS — N170 Acute kidney failure with tubular necrosis: Secondary | ICD-10-CM | POA: Diagnosis not present

## 2019-01-13 DIAGNOSIS — J80 Acute respiratory distress syndrome: Secondary | ICD-10-CM | POA: Diagnosis not present

## 2019-01-13 DIAGNOSIS — J9621 Acute and chronic respiratory failure with hypoxia: Secondary | ICD-10-CM | POA: Diagnosis not present

## 2019-01-13 DIAGNOSIS — J850 Gangrene and necrosis of lung: Secondary | ICD-10-CM | POA: Diagnosis not present

## 2019-01-13 LAB — BASIC METABOLIC PANEL
Anion gap: 10 (ref 5–15)
BUN: 31 mg/dL — ABNORMAL HIGH (ref 8–23)
CO2: 26 mmol/L (ref 22–32)
Calcium: 7.9 mg/dL — ABNORMAL LOW (ref 8.9–10.3)
Chloride: 96 mmol/L — ABNORMAL LOW (ref 98–111)
Creatinine, Ser: 0.41 mg/dL — ABNORMAL LOW (ref 0.61–1.24)
GFR calc Af Amer: >60 mL/min (ref >60)
GFR calc non Af Amer: >60 mL/min (ref >60)
Glucose, Bld: 109 mg/dL — ABNORMAL HIGH (ref 70–99)
Potassium: 5.8 mmol/L — ABNORMAL HIGH (ref 3.5–5.1)
Sodium: 132 mmol/L — ABNORMAL LOW (ref 135–145)

## 2019-01-13 NOTE — Progress Notes (Addendum)
Pulmonary Critical Care Medicine [facility] [place]   PULMONARY CRITICAL CARE SERVICE  PROGRESS NOTE  Date of Service: 01/13/2019  Benjamin [name]  VPX:[id number]  DOB: 01-21-1956   DOA: 12/14/2018  Referring Physician: Carron Curie, MD  HPI: Benjamin [name] is a 62 y.o. male seen for follow up of Acute on Chronic Respiratory Failure.  Patient is currently on pressure support 12/8 with an FiO2 of 50% he is on aerosol trach collar yesterday however had to go back on the vent last night due to excessive secretions and desaturations.  Medications: Reviewed on Rounds  Physical Exam:  Vitals: Pulse 73 respirations 13 BP 114/63 O2 sat 93% temp 97.0  Ventilator Settings pressure 12/8 FiO2 50%  . General: Comfortable at this time . Eyes: Grossly normal lids, irises & conjunctiva . ENT: grossly tongue is normal . Neck: no obvious mass . Cardiovascular: S1 S2 normal no gallop . Respiratory: Nose or rhonchi noted . Abdomen: soft . Skin: no rash seen on limited exam . Musculoskeletal: not rigid . Psychiatric:unable to assess . Neurologic: no seizure no involuntary movements         Lab Data:   Basic Metabolic Panel: Recent Labs  Lab 01/11/19 0746 01/13/19 0858  NA 136 132*  K 4.5 5.8*  CL 99 96*  CO2 29 26  GLUCOSE 135* 109*  BUN 31* 31*  CREATININE 0.51* 0.41*  CALCIUM 8.1* 7.9*    ABG: No results for input(s): PHART, PCO2ART, PO2ART, HCO3, O2SAT in the last 168 hours.  Liver Function Tests: No results for input(s): AST, ALT, ALKPHOS, BILITOT, PROT, ALBUMIN in the last 168 hours. No results for input(s): LIPASE, AMYLASE in the last 168 hours. No results for input(s): AMMONIA in the last 168 hours.  CBC: Recent Labs  Lab 01/11/19 0746  WBC 8.0  HGB 7.3*  HCT 22.9*  MCV 99.1  PLT 321    Cardiac Enzymes: No results for input(s): CKTOTAL, CKMB, CKMBINDEX, TROPONINI in the last 168 hours.  BNP (last 3 results) No results for input(s): BNP in the last  8760 hours.  ProBNP (last 3 results) No results for input(s): PROBNP in the last 8760 hours.  Radiological Exams: Dg Chest Port 1 View  Result Date: 01/13/2019 CLINICAL DATA:  Follow-up pneumonia EXAM: PORTABLE CHEST 1 VIEW COMPARISON:  01/09/2019 FINDINGS: Tracheostomy tube and feeding catheter are noted in satisfactory position. Small bilateral pleural effusions are again seen and stable. No focal infiltrate is noted. Lungs are again hyperinflated. IMPRESSION: Small effusions bilaterally stable from the previous exam. No focal infiltrate is seen. Electronically Signed   By: Alcide Clever M.D.   On: 01/13/2019 13:00    Assessment/Plan Active Problems:   Acute on chronic respiratory failure with hypoxia (HCC)   Acute renal failure due to tubular necrosis (HCC)   Strangulated inguinal hernia   Necrotizing pneumonia (HCC)   Paroxysmal atrial fibrillation with rapid ventricular response (HCC)   ARDS (adult respiratory distress syndrome) (HCC)   Severe sepsis (HCC)   1. Acute on chronic respiratory failure with hypoxiapatient is currently on pressure support 12/8 with an FiO2 of 50%. Continue supportive measures and pulmonary toilet. 2. Acute renal failure we will monitor labs 3. Strangulated hernia status post repair 4. Necrotizing pneumonia clinically improving follow-up chest x-ray ordered 5. Paroxysmal atrial fibrillation rate is currently controlled 6. ARDS clinically resolved 7. Severe sepsis resolved   I have personally seen and evaluated the patient, evaluated laboratory and imaging results, formulated the assessment  and plan and placed orders. The Patient requires high complexity decision making for assessment and support.  Case was discussed on Rounds with the Respiratory Therapy Staff  Allyne Gee, MD [facility] Pulmonary Critical Care Medicine Sleep Medicine

## 2019-01-14 DIAGNOSIS — N170 Acute kidney failure with tubular necrosis: Secondary | ICD-10-CM | POA: Diagnosis not present

## 2019-01-14 DIAGNOSIS — J9621 Acute and chronic respiratory failure with hypoxia: Secondary | ICD-10-CM | POA: Diagnosis not present

## 2019-01-14 DIAGNOSIS — J80 Acute respiratory distress syndrome: Secondary | ICD-10-CM | POA: Diagnosis not present

## 2019-01-14 DIAGNOSIS — J850 Gangrene and necrosis of lung: Secondary | ICD-10-CM | POA: Diagnosis not present

## 2019-01-14 NOTE — Progress Notes (Addendum)
Pulmonary Critical Care Medicine [facility] [place]   PULMONARY CRITICAL CARE SERVICE  PROGRESS NOTE  Date of Service: 01/14/2019  Benjamin LedgerRick E Hixon  ZOX:[id number]  DOB: [birth year]   DOA: 12/14/2018  Referring Physician: Carron CurieAli Hijazi, MD  HPI: Benjamin [name] is a 63 y.o. male seen for follow up of Acute on Chronic Respiratory Failure.  Patient was able to do 1 hour on aerosol trach collar today and is now back on pressure support 12/8 with an FiO2 of 50%.  Satting well at this time with no distress.  Medications: Reviewed on Rounds  Physical Exam:  Vitals: Pulse 94 respirations 23 BP 133/69 O2 sat 95% temp 97.8  Ventilator Settings pressure support 12/8 50% O2  . General: Comfortable at this time . Eyes: Grossly normal lids, irises & conjunctiva . ENT: grossly tongue is normal . Neck: no obvious mass . Cardiovascular: S1 S2 normal no gallop . Respiratory: No rales or rhonchi noted . Abdomen: soft . Skin: no rash seen on limited exam . Musculoskeletal: not rigid . Psychiatric:unable to assess . Neurologic: no seizure no involuntary movements         Lab Data:   Basic Metabolic Panel: Recent Labs  Lab 01/11/19 0746 01/13/19 0858  NA 136 132*  K 4.5 5.8*  CL 99 96*  CO2 29 26  GLUCOSE 135* 109*  BUN 31* 31*  CREATININE 0.51* 0.41*  CALCIUM 8.1* 7.9*    ABG: No results for input(s): PHART, PCO2ART, PO2ART, HCO3, O2SAT in the last 168 hours.  Liver Function Tests: No results for input(s): AST, ALT, ALKPHOS, BILITOT, PROT, ALBUMIN in the last 168 hours. No results for input(s): LIPASE, AMYLASE in the last 168 hours. No results for input(s): AMMONIA in the last 168 hours.  CBC: Recent Labs  Lab 01/11/19 0746  WBC 8.0  HGB 7.3*  HCT 22.9*  MCV 99.1  PLT 321    Cardiac Enzymes: No results for input(s): CKTOTAL, CKMB, CKMBINDEX, TROPONINI in the last 168 hours.  BNP (last 3 results) No results for input(s): BNP in the last 8760 hours.  ProBNP  (last 3 results) No results for input(s): PROBNP in the last 8760 hours.  Radiological Exams: Dg Chest Port 1 View  Result Date: 01/13/2019 CLINICAL DATA:  Follow-up pneumonia EXAM: PORTABLE CHEST 1 VIEW COMPARISON:  01/09/2019 FINDINGS: Tracheostomy tube and feeding catheter are noted in satisfactory position. Small bilateral pleural effusions are again seen and stable. No focal infiltrate is noted. Lungs are again hyperinflated. IMPRESSION: Small effusions bilaterally stable from the previous exam. No focal infiltrate is seen. Electronically Signed   By: Alcide CleverMark  Lukens M.D.   On: 01/13/2019 13:00    Assessment/Plan Active Problems:   Acute on chronic respiratory failure with hypoxia (HCC)   Acute renal failure due to tubular necrosis (HCC)   Strangulated inguinal hernia   Necrotizing pneumonia (HCC)   Paroxysmal atrial fibrillation with rapid ventricular response (HCC)   ARDS (adult respiratory distress syndrome) (HCC)   Severe sepsis (HCC)   1. Acute on chronic respiratory failure with hypoxiapatient is currently on pressure support 12/8 with an FiO2 of 50%. He did tolerate 1 hour on aerosol trach collar today at 50% before being placed back on pressure support.  Continue supportive measures and pulmonary toilet. 2. Acute renal failure we will monitor labs 3. Strangulated hernia status post repair 4. Necrotizing pneumonia clinically improving follow-up chest x-ray ordered 5. Paroxysmal atrial fibrillation rate is currently controlled 6. ARDS clinically resolved 7.  Severe sepsis resolved   I have personally seen and evaluated the patient, evaluated laboratory and imaging results, formulated the assessment and plan and placed orders. The Patient requires high complexity decision making for assessment and support.  Case was discussed on Rounds with the Respiratory Therapy Staff  Yevonne Pax, MD [facility] Pulmonary Critical Care Medicine Sleep Medicine

## 2019-01-15 DIAGNOSIS — J9621 Acute and chronic respiratory failure with hypoxia: Secondary | ICD-10-CM | POA: Diagnosis not present

## 2019-01-15 DIAGNOSIS — N170 Acute kidney failure with tubular necrosis: Secondary | ICD-10-CM | POA: Diagnosis not present

## 2019-01-15 DIAGNOSIS — J80 Acute respiratory distress syndrome: Secondary | ICD-10-CM | POA: Diagnosis not present

## 2019-01-15 DIAGNOSIS — J850 Gangrene and necrosis of lung: Secondary | ICD-10-CM | POA: Diagnosis not present

## 2019-01-15 LAB — CBC
HCT: 25.7 % — ABNORMAL LOW (ref 39.0–52.0)
Hemoglobin: 8.2 g/dL — ABNORMAL LOW (ref 13.0–17.0)
MCH: 31.2 pg (ref 26.0–34.0)
MCHC: 31.9 g/dL (ref 30.0–36.0)
MCV: 97.7 fL (ref 80.0–100.0)
Platelets: 374 10*3/uL (ref 150–400)
RBC: 2.63 MIL/uL — ABNORMAL LOW (ref 4.22–5.81)
RDW: 17.9 % — ABNORMAL HIGH (ref 11.5–15.5)
WBC: 10 10*3/uL (ref 4.0–10.5)
nRBC: 0 % (ref 0.0–0.2)

## 2019-01-15 LAB — BASIC METABOLIC PANEL
Anion gap: 11 (ref 5–15)
BUN: 37 mg/dL — ABNORMAL HIGH (ref 8–23)
CO2: 28 mmol/L (ref 22–32)
Calcium: 7.7 mg/dL — ABNORMAL LOW (ref 8.9–10.3)
Chloride: 94 mmol/L — ABNORMAL LOW (ref 98–111)
Creatinine, Ser: 0.47 mg/dL — ABNORMAL LOW (ref 0.61–1.24)
GFR calc Af Amer: >60 mL/min (ref >60)
GFR calc non Af Amer: >60 mL/min (ref >60)
Glucose, Bld: 116 mg/dL — ABNORMAL HIGH (ref 70–99)
Potassium: 4.1 mmol/L (ref 3.5–5.1)
Sodium: 133 mmol/L — ABNORMAL LOW (ref 135–145)

## 2019-01-15 NOTE — Progress Notes (Addendum)
Pulmonary Critical Care Medicine [place]   PULMONARY CRITICAL CARE SERVICE  PROGRESS NOTE  Date of Service: 01/15/2019  Benjamin [name]  ZHY:[id number]  DOB: 07-Jun-1956   DOA: 12/14/2018  Referring Physician: Merton Border, MD  HPI: Benjamin [name] is a 63 y.o. male seen for follow up of Acute on Chronic Respiratory Failure.  Patient remains on pressure support 12/8 with an FiO2 of 50% satting well with no distress.  Medications: Reviewed on Rounds  Physical Exam:  Vitals: Pulse 72 respirations 10 BP 133/65 O2 sat 97% temp 97.9  Ventilator Settings pressure support 12/8 with an FiO2 of 50%  . General: Comfortable at this time . Eyes: Grossly normal lids, irises & conjunctiva . ENT: grossly tongue is normal . Neck: no obvious mass . Cardiovascular: S1 S2 normal no gallop . Respiratory: No rales or rhonchi noted . Abdomen: soft . Skin: no rash seen on limited exam . Musculoskeletal: not rigid . Psychiatric:unable to assess . Neurologic: no seizure no involuntary movements         Lab Data:   Basic Metabolic Panel: Recent Labs  Lab 01/11/19 0746 01/13/19 0858 01/15/19 0428  NA 136 132* 133*  K 4.5 5.8* 4.1  CL 99 96* 94*  CO2 29 26 28   GLUCOSE 135* 109* 116*  BUN 31* 31* 37*  CREATININE 0.51* 0.41* 0.47*  CALCIUM 8.1* 7.9* 7.7*    ABG: No results for input(s): PHART, PCO2ART, PO2ART, HCO3, O2SAT in the last 168 hours.  Liver Function Tests: No results for input(s): AST, ALT, ALKPHOS, BILITOT, PROT, ALBUMIN in the last 168 hours. No results for input(s): LIPASE, AMYLASE in the last 168 hours. No results for input(s): AMMONIA in the last 168 hours.  CBC: Recent Labs  Lab 01/11/19 0746 01/15/19 0428  WBC 8.0 10.0  HGB 7.3* 8.2*  HCT 22.9* 25.7*  MCV 99.1 97.7  PLT 321 374    Cardiac Enzymes: No results for input(s): CKTOTAL, CKMB, CKMBINDEX, TROPONINI in the last 168 hours.  BNP (last 3 results) No results for input(s): BNP in  the last 8760 hours.  ProBNP (last 3 results) No results for input(s): PROBNP in the last 8760 hours.  Radiological Exams: No results found.  Assessment/Plan Active Problems:   Acute on chronic respiratory failure with hypoxia (HCC)   Acute renal failure due to tubular necrosis (HCC)   Strangulated inguinal hernia   Necrotizing pneumonia (HCC)   Paroxysmal atrial fibrillation with rapid ventricular response (HCC)   ARDS (adult respiratory distress syndrome) (HCC)   Severe sepsis ([place])   1. Acute on chronic respiratory failure with hypoxiapatient is currently on pressure support 12/8 with an FiO2 of 50%. He did tolerate 1 hour on aerosol trach collar today at 50% before being placed back on pressure support.  Continue supportive measures and pulmonary toilet. 2. Acute renal failure we will monitor labs 3. Strangulated hernia status post repair 4. Necrotizing pneumonia clinically improving follow-up chest x-ray ordered 5. Paroxysmal atrial fibrillation rate is currently controlled 6. ARDS clinically resolved 7. Severe sepsis resolved   I have personally seen and evaluated the patient, evaluated laboratory and imaging results, formulated the assessment and plan and placed orders. The Patient requires high complexity decision making for assessment and support.  Case was discussed on Rounds with the Respiratory Therapy Staff  Allyne Gee, MD [facility] Pulmonary Critical Care Medicine Sleep Medicine

## 2019-01-16 DIAGNOSIS — J850 Gangrene and necrosis of lung: Secondary | ICD-10-CM | POA: Diagnosis not present

## 2019-01-16 DIAGNOSIS — J80 Acute respiratory distress syndrome: Secondary | ICD-10-CM | POA: Diagnosis not present

## 2019-01-16 DIAGNOSIS — J9621 Acute and chronic respiratory failure with hypoxia: Secondary | ICD-10-CM | POA: Diagnosis not present

## 2019-01-16 DIAGNOSIS — N170 Acute kidney failure with tubular necrosis: Secondary | ICD-10-CM | POA: Diagnosis not present

## 2019-01-16 NOTE — Progress Notes (Addendum)
Pulmonary Critical Care Medicine [place]   PULMONARY CRITICAL CARE SERVICE  PROGRESS NOTE  Date of Service: 01/16/2019  Benjamin [name]  [id number]  DOB: 05-23-1956   DOA: 12/14/2018  Referring Physician: Merton Border, MD  HPI: Benjamin [name] is a 63 y.o. male seen for follow up of Acute on Chronic Respiratory Failure.  Patient was able to tolerate 6 hours on aerosol trach collar yesterday on 50%.  Patient has orders for as tolerated pressure support continuing at this time.  No distress or fever noted.  Medications: Reviewed on Rounds  Physical Exam:  Vitals: Pulse 82 respirations 10 BP 99/52 O2 sat 100% temp 96.9  Ventilator Settings pressure support 12/8 FiO2 50%  . General: Comfortable at this time . Eyes: Grossly normal lids, irises & conjunctiva . ENT: grossly tongue is normal . Neck: no obvious mass . Cardiovascular: S1 S2 normal no gallop . Respiratory: No rales rhonchi noted . Abdomen: soft . Skin: no rash seen on limited exam . Musculoskeletal: not rigid . Psychiatric:unable to assess . Neurologic: no seizure no involuntary movements         Lab Data:   Basic Metabolic Panel: Recent Labs  Lab 01/11/19 0746 01/13/19 0858 01/15/19 0428  NA 136 132* 133*  K 4.5 5.8* 4.1  CL 99 96* 94*  CO2 29 26 28   GLUCOSE 135* 109* 116*  BUN 31* 31* 37*  CREATININE 0.51* 0.41* 0.47*  CALCIUM 8.1* 7.9* 7.7*    ABG: No results for input(s): PHART, PCO2ART, PO2ART, HCO3, O2SAT in the last 168 hours.  Liver Function Tests: No results for input(s): AST, ALT, ALKPHOS, BILITOT, PROT, ALBUMIN in the last 168 hours. No results for input(s): LIPASE, AMYLASE in the last 168 hours. No results for input(s): AMMONIA in the last 168 hours.  CBC: Recent Labs  Lab 01/11/19 0746 01/15/19 0428  WBC 8.0 10.0  HGB 7.3* 8.2*  HCT 22.9* 25.7*  MCV 99.1 97.7  PLT 321 374    Cardiac Enzymes: No results for input(s): CKTOTAL, CKMB, CKMBINDEX, TROPONINI  in the last 168 hours.  BNP (last 3 results) No results for input(s): BNP in the last 8760 hours.  ProBNP (last 3 results) No results for input(s): PROBNP in the last 8760 hours.  Radiological Exams: No results found.  Assessment/Plan Active Problems:   Acute on chronic respiratory failure with hypoxia (HCC)   Acute renal failure due to tubular necrosis (HCC)   Strangulated inguinal hernia   Necrotizing pneumonia (HCC)   Paroxysmal atrial fibrillation with rapid ventricular response (HCC)   ARDS (adult respiratory distress syndrome) (HCC)   Severe sepsis ([place])   1. Acute on chronic respiratory failure with hypoxiapatient is currently on pressure support 12/8 with an FiO2 of 50%. Satting well with no distress continue supportive measures and pulmonary toilet. 2. Acute renal failure we will monitor labs 3. Strangulated hernia status post repair 4. Necrotizing pneumonia clinically improving follow-up chest x-ray ordered 5. Paroxysmal atrial fibrillation rate is currently controlled 6. ARDS clinically resolved 7. Severe sepsis resolved   I have personally seen and evaluated the patient, evaluated laboratory and imaging results, formulated the assessment and plan and placed orders. The Patient requires high complexity decision making for assessment and support.  Case was discussed on Rounds with the Respiratory Therapy Staff  Allyne Gee, MD [facility] Pulmonary Critical Care Medicine Sleep Medicine

## 2019-01-17 ENCOUNTER — Other Ambulatory Visit (HOSPITAL_COMMUNITY): Payer: Self-pay

## 2019-01-17 DIAGNOSIS — N170 Acute kidney failure with tubular necrosis: Secondary | ICD-10-CM | POA: Diagnosis not present

## 2019-01-17 DIAGNOSIS — J850 Gangrene and necrosis of lung: Secondary | ICD-10-CM | POA: Diagnosis not present

## 2019-01-17 DIAGNOSIS — J80 Acute respiratory distress syndrome: Secondary | ICD-10-CM | POA: Diagnosis not present

## 2019-01-17 DIAGNOSIS — J9621 Acute and chronic respiratory failure with hypoxia: Secondary | ICD-10-CM | POA: Diagnosis not present

## 2019-01-17 LAB — CBC
HCT: 29.5 % — ABNORMAL LOW (ref 39.0–52.0)
Hemoglobin: 9.6 g/dL — ABNORMAL LOW (ref 13.0–17.0)
MCH: 31.7 pg (ref 26.0–34.0)
MCHC: 32.5 g/dL (ref 30.0–36.0)
MCV: 97.4 fL (ref 80.0–100.0)
Platelets: 504 10*3/uL — ABNORMAL HIGH (ref 150–400)
RBC: 3.03 MIL/uL — ABNORMAL LOW (ref 4.22–5.81)
RDW: 17.4 % — ABNORMAL HIGH (ref 11.5–15.5)
WBC: 13.9 10*3/uL — ABNORMAL HIGH (ref 4.0–10.5)
nRBC: 0 % (ref 0.0–0.2)

## 2019-01-17 LAB — BASIC METABOLIC PANEL
Anion gap: 11 (ref 5–15)
BUN: 35 mg/dL — ABNORMAL HIGH (ref 8–23)
CO2: 30 mmol/L (ref 22–32)
Calcium: 8.2 mg/dL — ABNORMAL LOW (ref 8.9–10.3)
Chloride: 94 mmol/L — ABNORMAL LOW (ref 98–111)
Creatinine, Ser: 0.45 mg/dL — ABNORMAL LOW (ref 0.61–1.24)
GFR calc Af Amer: >60 mL/min (ref >60)
GFR calc non Af Amer: >60 mL/min (ref >60)
Glucose, Bld: 118 mg/dL — ABNORMAL HIGH (ref 70–99)
Potassium: 3.6 mmol/L (ref 3.5–5.1)
Sodium: 135 mmol/L (ref 135–145)

## 2019-01-17 LAB — CULTURE, RESPIRATORY W GRAM STAIN

## 2019-01-17 NOTE — Progress Notes (Signed)
Pulmonary Critical Care Medicine [place]   PULMONARY CRITICAL CARE SERVICE  PROGRESS NOTE  Date of Service: 01/17/2019  Benjamin [name]  Benjamin [name]  DOB: 1955/09/23   DOA: 12/14/2018  Referring Physician: Merton Border, MD  HPI: Benjamin [name] is a 63 y.o. male seen for follow up of Acute on Chronic Respiratory Failure.  Patient remains on pressure support mode currently is on 50% FiO2  Medications: Reviewed on Rounds  Physical Exam:  Vitals: Temperature 97.3 pulse 64 respiratory rate 10 blood pressure 117/56  Ventilator Settings mode ventilation pressure support FiO2 is 50% pressure poor 12/8  . General: Comfortable at this time . Eyes: Grossly normal lids, irises & conjunctiva . ENT: grossly tongue is normal . Neck: no obvious mass . Cardiovascular: S1 S2 normal no gallop . Respiratory: No rhonchi or rales are noted at this time . Abdomen: soft . Skin: no rash seen on limited exam . Musculoskeletal: not rigid . Psychiatric:unable to assess . Neurologic: no seizure no involuntary movements         Lab Data:   Basic Metabolic Panel: Recent Labs  Lab 01/11/19 0746 01/13/19 0858 01/15/19 0428  NA 136 132* 133*  K 4.5 5.8* 4.1  CL 99 96* 94*  CO2 29 26 28   GLUCOSE 135* 109* 116*  BUN 31* 31* 37*  CREATININE 0.51* 0.41* 0.47*  CALCIUM 8.1* 7.9* 7.7*    ABG: No results for input(s): PHART, PCO2ART, PO2ART, HCO3, O2SAT in the last 168 hours.  Liver Function Tests: No results for input(s): AST, ALT, ALKPHOS, BILITOT, PROT, ALBUMIN in the last 168 hours. No results for input(s): LIPASE, AMYLASE in the last 168 hours. No results for input(s): AMMONIA in the last 168 hours.  CBC: Recent Labs  Lab 01/11/19 0746 01/15/19 0428  WBC 8.0 10.0  HGB 7.3* 8.2*  HCT 22.9* 25.7*  MCV 99.1 97.7  PLT 321 374    Cardiac Enzymes: No results for input(s): CKTOTAL, CKMB, CKMBINDEX, TROPONINI in the last 168 hours.  BNP (last 3 results) No  results for input(s): BNP in the last 8760 hours.  ProBNP (last 3 results) No results for input(s): PROBNP in the last 8760 hours.  Radiological Exams: Dg Chest Port 1 View  Result Date: 01/17/2019 CLINICAL DATA:  Respiratory failure EXAM: PORTABLE CHEST 1 VIEW COMPARISON:  01/13/2019 FINDINGS: Cardiac shadow is within normal limits. Feeding catheter and tracheostomy tube are noted in satisfactory position. Stable small effusions are seen. No pneumothorax is noted. Slight increased density in the left retrocardiac region is noted consistent with lower lobe consolidation. IMPRESSION: Stable small effusions. Slight increase in left retrocardiac consolidation. Electronically Signed   By: Inez Catalina M.D.   On: 01/17/2019 07:14    Assessment/Plan Active Problems:   Acute on chronic respiratory failure with hypoxia (HCC)   Acute renal failure due to tubular necrosis (HCC)   Strangulated inguinal hernia   Necrotizing pneumonia (HCC)   Paroxysmal atrial fibrillation with rapid ventricular response (HCC)   ARDS (adult respiratory distress syndrome) (HCC)   Severe sepsis ([place])   1. Acute on chronic respiratory failure with hypoxia continue with full support on pressure support titrate oxygen spoke with respiratory therapy on rounds to try to wean on to T collar. 2. Acute renal failure with tubular necrosis continue with present management 3. Strangulated hernia status post repair 4. Necrotizing pneumonia improving we will continue to monitor although chest x-ray showing slight increased consolidation retrocardiac 5. Paroxysmal atrial fibrillation rate controlled  6. ARDS resolved 7. Severe sepsis hemodynamically stable   I have personally seen and evaluated the patient, evaluated laboratory and imaging results, formulated the assessment and plan and placed orders. The Patient requires high complexity decision making for assessment and support.  Case was discussed on Rounds with the Respiratory  Therapy Staff  Yevonne PaxSaadat A Joaopedro Eschbach, MD [facility] Pulmonary Critical Care Medicine Sleep Medicine

## 2019-01-18 DIAGNOSIS — J80 Acute respiratory distress syndrome: Secondary | ICD-10-CM | POA: Diagnosis not present

## 2019-01-18 DIAGNOSIS — J850 Gangrene and necrosis of lung: Secondary | ICD-10-CM | POA: Diagnosis not present

## 2019-01-18 DIAGNOSIS — J9621 Acute and chronic respiratory failure with hypoxia: Secondary | ICD-10-CM | POA: Diagnosis not present

## 2019-01-18 DIAGNOSIS — N170 Acute kidney failure with tubular necrosis: Secondary | ICD-10-CM | POA: Diagnosis not present

## 2019-01-18 LAB — BASIC METABOLIC PANEL
Anion gap: 8 (ref 5–15)
BUN: 36 mg/dL — ABNORMAL HIGH (ref 8–23)
CO2: 31 mmol/L (ref 22–32)
Calcium: 7.9 mg/dL — ABNORMAL LOW (ref 8.9–10.3)
Chloride: 94 mmol/L — ABNORMAL LOW (ref 98–111)
Creatinine, Ser: 0.45 mg/dL — ABNORMAL LOW (ref 0.61–1.24)
GFR calc Af Amer: >60 mL/min (ref >60)
GFR calc non Af Amer: >60 mL/min (ref >60)
Glucose, Bld: 136 mg/dL — ABNORMAL HIGH (ref 70–99)
Potassium: 3.3 mmol/L — ABNORMAL LOW (ref 3.5–5.1)
Sodium: 133 mmol/L — ABNORMAL LOW (ref 135–145)

## 2019-01-18 NOTE — Progress Notes (Signed)
Pulmonary Critical Care Medicine [place]   PULMONARY CRITICAL CARE SERVICE  PROGRESS NOTE  Date of Service: 01/18/2019  Benjamin [name]  ZOX:[id number]  DOB: [birth year]   DOA: 12/14/2018  Referring Physician: Merton Border, MD  HPI: Benjamin [name] is a 63 y.o. male seen for follow up of Acute on Chronic Respiratory Failure.  Patient is on full vent support at this time on assist control mode currently is on 40% FiO2 with a PEEP of 5 should be able to try T collar today again patient has been treated for yeast and stenotrophomonas  Medications: Reviewed on Rounds  Physical Exam:  Vitals: Temperature 97.3 pulse 61 respiratory rate 18 blood pressure 96/59 saturations 94%  Ventilator Settings mode ventilation assist control FiO2 40% tidal volume 600 PEEP 5  . General: Comfortable at this time . Eyes: Grossly normal lids, irises & conjunctiva . ENT: grossly tongue is normal . Neck: no obvious mass . Cardiovascular: S1 S2 normal no gallop . Respiratory: No rhonchi no rales are noted . Abdomen: soft . Skin: no rash seen on limited exam . Musculoskeletal: not rigid . Psychiatric:unable to assess . Neurologic: no seizure no involuntary movements         Lab Data:   Basic Metabolic Panel: Recent Labs  Lab 01/13/19 0858 01/15/19 0428 01/17/19 1050 01/18/19 0511  NA 132* 133* 135 133*  K 5.8* 4.1 3.6 3.3*  CL 96* 94* 94* 94*  CO2 26 28 30 31   GLUCOSE 109* 116* 118* 136*  BUN 31* 37* 35* 36*  CREATININE 0.41* 0.47* 0.45* 0.45*  CALCIUM 7.9* 7.7* 8.2* 7.9*    ABG: No results for input(s): PHART, PCO2ART, PO2ART, HCO3, O2SAT in the last 168 hours.  Liver Function Tests: No results for input(s): AST, ALT, ALKPHOS, BILITOT, PROT, ALBUMIN in the last 168 hours. No results for input(s): LIPASE, AMYLASE in the last 168 hours. No results for input(s): AMMONIA in the last 168 hours.  CBC: Recent Labs  Lab 01/15/19 0428 01/17/19 1234  WBC 10.0 13.9*  HGB  8.2* 9.6*  HCT 25.7* 29.5*  MCV 97.7 97.4  PLT 374 504*    Cardiac Enzymes: No results for input(s): CKTOTAL, CKMB, CKMBINDEX, TROPONINI in the last 168 hours.  BNP (last 3 results) No results for input(s): BNP in the last 8760 hours.  ProBNP (last 3 results) No results for input(s): PROBNP in the last 8760 hours.  Radiological Exams: Ct Abdomen Pelvis Wo Contrast  Result Date: 01/17/2019 CLINICAL DATA:  63 year old male with history of ascites. Followup study. EXAM: CT ABDOMEN AND PELVIS WITHOUT CONTRAST TECHNIQUE: Multidetector CT imaging of the abdomen and pelvis was performed following the standard protocol without IV contrast. COMPARISON:  CT the abdomen and pelvis 12/15/2018. FINDINGS: Lower chest: Small bilateral pleural effusions (left greater than right). Feeding tube in the esophagus. Hepatobiliary: No definite suspicious cystic or solid hepatic lesions are confidently identified on today's noncontrast CT examination. Unenhanced appearance of the gallbladder is unremarkable. Pancreas: No definite pancreatic mass or peripancreatic fluid collections or inflammatory changes noted on today's noncontrast CT examination. Spleen: Unremarkable. Adrenals/Urinary Tract: Bilateral kidneys and adrenal glands are normal in appearance. No hydroureteronephrosis. Stomach/Bowel: Feeding tube tip terminating in the antral pre-pyloric region of the stomach. Unenhanced appearance of the stomach is unremarkable. No pathologic dilatation of small bowel or colon. Normal appendix. Vascular/Lymphatic: Aortic atherosclerosis. No lymphadenopathy noted in the abdomen or pelvis. Reproductive: Prostate gland and seminal vesicles are unremarkable in appearance. Other: Small volume  of ascites, significantly decreased compared to the prior examination. Postoperative changes of right inguinal herniorrhaphy with persistent open wound and packing material in place. No pneumoperitoneum. Musculoskeletal: Open midline wound  again noted. Diffuse body wall edema. There are no aggressive appearing lytic or blastic lesions noted in the visualized portions of the skeleton. IMPRESSION: 1. Findings are again compatible with a state of anasarca, including small bilateral pleural effusions, small volume of ascites (decreased compared to the prior study), and diffuse body wall edema. 2. Persistent open mid lower abdominal midline surgical wound and open wound in the right inguinal region from right inguinal herniorrhaphy with overlying packing material in place. 3. Aortic atherosclerosis. 4. Additional incidental findings, as above. Electronically Signed   By: Trudie Reedaniel  Entrikin M.D.   On: 01/17/2019 18:30   Dg Chest Port 1 View  Result Date: 01/17/2019 CLINICAL DATA:  Respiratory failure EXAM: PORTABLE CHEST 1 VIEW COMPARISON:  01/13/2019 FINDINGS: Cardiac shadow is within normal limits. Feeding catheter and tracheostomy tube are noted in satisfactory position. Stable small effusions are seen. No pneumothorax is noted. Slight increased density in the left retrocardiac region is noted consistent with lower lobe consolidation. IMPRESSION: Stable small effusions. Slight increase in left retrocardiac consolidation. Electronically Signed   By: Alcide CleverMark  Lukens M.D.   On: 01/17/2019 07:14    Assessment/Plan Active Problems:   Acute on chronic respiratory failure with hypoxia (HCC)   Acute renal failure due to tubular necrosis (HCC)   Strangulated inguinal hernia   Necrotizing pneumonia (HCC)   Paroxysmal atrial fibrillation with rapid ventricular response (HCC)   ARDS (adult respiratory distress syndrome) (HCC)   Severe sepsis (HCC)   1. Acute on chronic respiratory failure with hypoxia we will continue to advance wean today patient will hopefully be able to go back to T collar. 2. Acute renal failure tubular necrosis follow-up on labs 3. Strangulated hernia status post repair 4. Necrotizing pneumonia improving 5. Paroxysmal atrial  fibrillation rate controlled 6. ARDS resolving 7. Severe sepsis right now hemodynamically stable   I have personally seen and evaluated the patient, evaluated laboratory and imaging results, formulated the assessment and plan and placed orders. The Patient requires high complexity decision making for assessment and support.  Case was discussed on Rounds with the Respiratory Therapy Staff  Yevonne PaxSaadat A Khan, MD [facility] Pulmonary Critical Care Medicine Sleep Medicine

## 2019-01-19 DIAGNOSIS — J80 Acute respiratory distress syndrome: Secondary | ICD-10-CM | POA: Diagnosis not present

## 2019-01-19 DIAGNOSIS — N170 Acute kidney failure with tubular necrosis: Secondary | ICD-10-CM | POA: Diagnosis not present

## 2019-01-19 DIAGNOSIS — J9621 Acute and chronic respiratory failure with hypoxia: Secondary | ICD-10-CM | POA: Diagnosis not present

## 2019-01-19 DIAGNOSIS — J850 Gangrene and necrosis of lung: Secondary | ICD-10-CM | POA: Diagnosis not present

## 2019-01-19 LAB — BASIC METABOLIC PANEL
Anion gap: 8 (ref 5–15)
BUN: 36 mg/dL — ABNORMAL HIGH (ref 8–23)
CO2: 32 mmol/L (ref 22–32)
Calcium: 8.2 mg/dL — ABNORMAL LOW (ref 8.9–10.3)
Chloride: 93 mmol/L — ABNORMAL LOW (ref 98–111)
Creatinine, Ser: 0.4 mg/dL — ABNORMAL LOW (ref 0.61–1.24)
GFR calc Af Amer: >60 mL/min (ref >60)
GFR calc non Af Amer: >60 mL/min (ref >60)
Glucose, Bld: 137 mg/dL — ABNORMAL HIGH (ref 70–99)
Potassium: 3.8 mmol/L (ref 3.5–5.1)
Sodium: 133 mmol/L — ABNORMAL LOW (ref 135–145)

## 2019-01-19 LAB — MAGNESIUM: Magnesium: 1.6 mg/dL — ABNORMAL LOW (ref 1.7–2.4)

## 2019-01-19 NOTE — Progress Notes (Addendum)
[facility]   PULMONARY CRITICAL CARE SERVICE  PROGRESS NOTE  Date of Service: 01/19/2019  Benjamin LedgerRick E Szymanowski  WUJ:[id number]  DOB: Jul 13, 1956   DOA: 12/14/2018  Referring Physician: Carron CurieAli Hijazi, MD  HPI: Benjamin [name] is a 63 y.o. male seen for follow up of Acute on Chronic Respiratory Failure.  Patient has struggled with aerosol trach collar 50% FiO2 over the last few days only able to do an average of 2 hours.  Today he has done 2 and half hours so far and continues to do well.  Medications: Reviewed on Rounds  Physical Exam:  Vitals: Pulse 71 respirations 18 BP 107/66 O2 sat 8% temp 97.6  Ventilator Settings ATC 50%  . General: Comfortable at this time . Eyes: Grossly normal lids, irises & conjunctiva . ENT: grossly tongue is normal . Neck: no obvious mass . Cardiovascular: S1 S2 normal no gallop . Respiratory: No rales rhonchi noted . Abdomen: soft . Skin: no rash seen on limited exam . Musculoskeletal: not rigid . Psychiatric:unable to assess . Neurologic: no seizure no involuntary movements         Lab Data:   Basic Metabolic Panel: Recent Labs  Lab 01/13/19 0858 01/15/19 0428 01/17/19 1050 01/18/19 0511 01/19/19 0542  NA 132* 133* 135 133* 133*  K 5.8* 4.1 3.6 3.3* 3.8  CL 96* 94* 94* 94* 93*  CO2 26 28 30 31  32  GLUCOSE 109* 116* 118* 136* 137*  BUN 31* 37* 35* 36* 36*  CREATININE 0.41* 0.47* 0.45* 0.45* 0.40*  CALCIUM 7.9* 7.7* 8.2* 7.9* 8.2*  MG  --   --   --   --  1.6*    ABG: No results for input(s): PHART, PCO2ART, PO2ART, HCO3, O2SAT in the last 168 hours.  Liver Function Tests: No results for input(s): AST, ALT, ALKPHOS, BILITOT, PROT, ALBUMIN in the last 168 hours. No results for input(s): LIPASE, AMYLASE in the last 168 hours. No results for input(s): AMMONIA in the last 168 hours.  CBC: Recent Labs  Lab 01/15/19 0428 01/17/19 1234  WBC 10.0 13.9*  HGB 8.2* 9.6*  HCT 25.7* 29.5*   MCV 97.7 97.4  PLT 374 504*    Cardiac Enzymes: No results for input(s): CKTOTAL, CKMB, CKMBINDEX, TROPONINI in the last 168 hours.  BNP (last 3 results) No results for input(s): BNP in the last 8760 hours.  ProBNP (last 3 results) No results for input(s): PROBNP in the last 8760 hours.  Radiological Exams: Ct Abdomen Pelvis Wo Contrast  Result Date: 01/17/2019 CLINICAL DATA:  63 year old male with history of ascites. Followup study. EXAM: CT ABDOMEN AND PELVIS WITHOUT CONTRAST TECHNIQUE: Multidetector CT imaging of the abdomen and pelvis was performed following the standard protocol without IV contrast. COMPARISON:  CT the abdomen and pelvis 12/15/2018. FINDINGS: Lower chest: Small bilateral pleural effusions (left greater than right). Feeding tube in the esophagus. Hepatobiliary: No definite suspicious cystic or solid hepatic lesions are confidently identified on today's noncontrast CT examination. Unenhanced appearance of the gallbladder is unremarkable. Pancreas: No definite pancreatic mass or peripancreatic fluid collections or inflammatory changes noted on today's noncontrast CT examination. Spleen: Unremarkable. Adrenals/Urinary Tract: Bilateral kidneys and adrenal glands are normal in appearance. No hydroureteronephrosis. Stomach/Bowel: Feeding tube tip terminating in the antral pre-pyloric region of the stomach. Unenhanced appearance of the stomach is unremarkable. No pathologic dilatation of small bowel or colon. Normal appendix. Vascular/Lymphatic: Aortic atherosclerosis. No lymphadenopathy noted in the abdomen or pelvis. Reproductive:  Prostate gland and seminal vesicles are unremarkable in appearance. Other: Small volume of ascites, significantly decreased compared to the prior examination. Postoperative changes of right inguinal herniorrhaphy with persistent open wound and packing material in place. No pneumoperitoneum. Musculoskeletal: Open midline wound again noted. Diffuse body wall  edema. There are no aggressive appearing lytic or blastic lesions noted in the visualized portions of the skeleton. IMPRESSION: 1. Findings are again compatible with a state of anasarca, including small bilateral pleural effusions, small volume of ascites (decreased compared to the prior study), and diffuse body wall edema. 2. Persistent open mid lower abdominal midline surgical wound and open wound in the right inguinal region from right inguinal herniorrhaphy with overlying packing material in place. 3. Aortic atherosclerosis. 4. Additional incidental findings, as above. Electronically Signed   By: Vinnie Langton M.D.   On: 01/17/2019 18:30    Assessment/Plan Active Problems:   Acute on chronic respiratory failure with hypoxia (HCC)   Acute renal failure due to tubular necrosis (HCC)   Strangulated inguinal hernia   Necrotizing pneumonia (HCC)   Paroxysmal atrial fibrillation with rapid ventricular response (HCC)   ARDS (adult respiratory distress syndrome) (HCC)   Severe sepsis ([place])   1. Acute on chronic respiratory failure with hypoxia we will continue with aerosol trach collar 50% FiO2.  Continue to increase time interval as patient tolerate.  Acute renal failure tubular necrosis follow-up on labs 2. Strangulated hernia status post repair 3. Necrotizing pneumonia improving 4. Paroxysmal atrial fibrillation rate controlled 5. ARDS resolving 6. Severe sepsis right now hemodynamically stable   I have personally seen and evaluated the patient, evaluated laboratory and imaging results, formulated the assessment and plan and placed orders. The Patient requires high complexity decision making for assessment and support.  Case was discussed on Rounds with the Respiratory Therapy Staff  Allyne Gee, MD [facility] Pulmonary Critical Care Medicine Sleep Medicine

## 2019-01-20 DIAGNOSIS — J80 Acute respiratory distress syndrome: Secondary | ICD-10-CM | POA: Diagnosis not present

## 2019-01-20 DIAGNOSIS — N170 Acute kidney failure with tubular necrosis: Secondary | ICD-10-CM | POA: Diagnosis not present

## 2019-01-20 DIAGNOSIS — J850 Gangrene and necrosis of lung: Secondary | ICD-10-CM | POA: Diagnosis not present

## 2019-01-20 DIAGNOSIS — J9621 Acute and chronic respiratory failure with hypoxia: Secondary | ICD-10-CM | POA: Diagnosis not present

## 2019-01-20 LAB — BASIC METABOLIC PANEL
Anion gap: 9 (ref 5–15)
BUN: 35 mg/dL — ABNORMAL HIGH (ref 8–23)
CO2: 34 mmol/L — ABNORMAL HIGH (ref 22–32)
Calcium: 8.1 mg/dL — ABNORMAL LOW (ref 8.9–10.3)
Chloride: 91 mmol/L — ABNORMAL LOW (ref 98–111)
Creatinine, Ser: 0.53 mg/dL — ABNORMAL LOW (ref 0.61–1.24)
GFR calc Af Amer: >60 mL/min (ref >60)
GFR calc non Af Amer: >60 mL/min (ref >60)
Glucose, Bld: 116 mg/dL — ABNORMAL HIGH (ref 70–99)
Potassium: 3.7 mmol/L (ref 3.5–5.1)
Sodium: 134 mmol/L — ABNORMAL LOW (ref 135–145)

## 2019-01-20 LAB — MAGNESIUM: Magnesium: 1.6 mg/dL — ABNORMAL LOW (ref 1.7–2.4)

## 2019-01-20 NOTE — Progress Notes (Signed)
Pulmonary Critical Care Medicine [place]   PULMONARY CRITICAL CARE SERVICE  PROGRESS NOTE  Date of Service: 01/20/2019  Benjamin [name]  [id number]  DOB: 1955-09-29   DOA: 12/14/2018  Referring Physician: Merton Border, MD  HPI: Benjamin [name] is a 63 y.o. male seen for follow up of Acute on Chronic Respiratory Failure.  Currently patient is on T collar has been requiring 70% FiO2 probably looking at having to place back on the ventilator.  We did discuss the possibility of using high flow to see if this might help  Medications: Reviewed on Rounds  Physical Exam:  Vitals: Temperature 96.8 pulse 74 respiratory 18 blood pressure 101/55 saturations 90%  Ventilator Settings off the ventilator on T collar  . General: Comfortable at this time . Eyes: Grossly normal lids, irises & conjunctiva . ENT: grossly tongue is normal . Neck: no obvious mass . Cardiovascular: S1 S2 normal no gallop . Respiratory: Scattered rhonchi noted coarse breath sounds . Abdomen: soft . Skin: no rash seen on limited exam . Musculoskeletal: not rigid . Psychiatric:unable to assess . Neurologic: no seizure no involuntary movements         Lab Data:   Basic Metabolic Panel: Recent Labs  Lab 01/15/19 0428 01/17/19 1050 01/18/19 0511 01/19/19 0542 01/20/19 0520  NA 133* 135 133* 133* 134*  K 4.1 3.6 3.3* 3.8 3.7  CL 94* 94* 94* 93* 91*  CO2 28 30 31  32 34*  GLUCOSE 116* 118* 136* 137* 116*  BUN 37* 35* 36* 36* 35*  CREATININE 0.47* 0.45* 0.45* 0.40* 0.53*  CALCIUM 7.7* 8.2* 7.9* 8.2* 8.1*  MG  --   --   --  1.6* 1.6*    ABG: No results for input(s): PHART, PCO2ART, PO2ART, HCO3, O2SAT in the last 168 hours.  Liver Function Tests: No results for input(s): AST, ALT, ALKPHOS, BILITOT, PROT, ALBUMIN in the last 168 hours. No results for input(s): LIPASE, AMYLASE in the last 168 hours. No results for input(s): AMMONIA in the last 168 hours.  CBC: Recent Labs  Lab  01/15/19 0428 01/17/19 1234  WBC 10.0 13.9*  HGB 8.2* 9.6*  HCT 25.7* 29.5*  MCV 97.7 97.4  PLT 374 504*    Cardiac Enzymes: No results for input(s): CKTOTAL, CKMB, CKMBINDEX, TROPONINI in the last 168 hours.  BNP (last 3 results) No results for input(s): BNP in the last 8760 hours.  ProBNP (last 3 results) No results for input(s): PROBNP in the last 8760 hours.  Radiological Exams: No results found.  Assessment/Plan Active Problems:   Acute on chronic respiratory failure with hypoxia (HCC)   Acute renal failure due to tubular necrosis (HCC)   Strangulated inguinal hernia   Necrotizing pneumonia (HCC)   Paroxysmal atrial fibrillation with rapid ventricular response (HCC)   ARDS (adult respiratory distress syndrome) (HCC)   Severe sepsis ([place])   1. Acute on chronic respiratory failure hypoxia we will continue with titrating oxygen therapy switch over to heated high flow.  Patient may end up having to go back on the ventilator. 2. Acute renal failure follow labs hydrate as necessary. 3. Strangulated inguinal hernia status post repair 4. Necrotizing pneumonia we will continue to monitor 5. ARDS oxygen requirements have worsened significantly when going to try heated high flow if this does not help then will need to be placed back on the ventilator 6. Paroxysmal atrial fibrillation right now rate controlled continue supportive care 7. Severe sepsis hemodynamically stable continue to monitor  I have personally seen and evaluated the patient, evaluated laboratory and imaging results, formulated the assessment and plan and placed orders. The Patient requires high complexity decision making for assessment and support.  Case was discussed on Rounds with the Respiratory Therapy Staff  Allyne Gee, MD [facility] Pulmonary Critical Care Medicine Sleep Medicine

## 2019-01-21 ENCOUNTER — Other Ambulatory Visit (HOSPITAL_COMMUNITY): Payer: Self-pay

## 2019-01-21 DIAGNOSIS — N170 Acute kidney failure with tubular necrosis: Secondary | ICD-10-CM | POA: Diagnosis not present

## 2019-01-21 DIAGNOSIS — J850 Gangrene and necrosis of lung: Secondary | ICD-10-CM | POA: Diagnosis not present

## 2019-01-21 DIAGNOSIS — J9621 Acute and chronic respiratory failure with hypoxia: Secondary | ICD-10-CM | POA: Diagnosis not present

## 2019-01-21 DIAGNOSIS — J80 Acute respiratory distress syndrome: Secondary | ICD-10-CM | POA: Diagnosis not present

## 2019-01-21 LAB — BLOOD GAS, ARTERIAL
Acid-Base Excess: 9.2 mmol/L — ABNORMAL HIGH (ref 0.0–2.0)
Bicarbonate: 33.4 mmol/L — ABNORMAL HIGH (ref 20.0–28.0)
Drawn by: 30136
FIO2: 100
MECHVT: 450 mL
O2 Saturation: 90.9 %
PEEP: 8 cmH2O
Patient temperature: 98.6
RATE: 26 resp/min
pCO2 arterial: 47.8 mmHg (ref 32.0–48.0)
pH, Arterial: 7.459 — ABNORMAL HIGH (ref 7.350–7.450)
pO2, Arterial: 60.2 mmHg — ABNORMAL LOW (ref 83.0–108.0)

## 2019-01-21 LAB — BASIC METABOLIC PANEL
Anion gap: 8 (ref 5–15)
BUN: 27 mg/dL — ABNORMAL HIGH (ref 8–23)
CO2: 30 mmol/L (ref 22–32)
Calcium: 7.9 mg/dL — ABNORMAL LOW (ref 8.9–10.3)
Chloride: 95 mmol/L — ABNORMAL LOW (ref 98–111)
Creatinine, Ser: 0.41 mg/dL — ABNORMAL LOW (ref 0.61–1.24)
GFR calc Af Amer: >60 mL/min (ref >60)
GFR calc non Af Amer: >60 mL/min (ref >60)
Glucose, Bld: 115 mg/dL — ABNORMAL HIGH (ref 70–99)
Potassium: 4 mmol/L (ref 3.5–5.1)
Sodium: 133 mmol/L — ABNORMAL LOW (ref 135–145)

## 2019-01-21 LAB — AMMONIA: Ammonia: 31 umol/L (ref 9–35)

## 2019-01-21 LAB — TROPONIN I: Troponin I: <0.03 ng/mL (ref <0.03)

## 2019-01-21 NOTE — Progress Notes (Addendum)
Pulmonary Critical Care Medicine [place]   PULMONARY CRITICAL CARE SERVICE  PROGRESS NOTE  Date of Service: 01/21/2019  Benjamin [name]  KXF:[id number]  DOB: 1956-03-27   DOA: 12/14/2018  Referring Physician: Merton Border, MD  HPI: Benjamin [name] is a 63 y.o. male seen for follow up of Acute on Chronic Respiratory Failure.  Patient was initially on heated high flow nasal cannula however due to shortness of breath and increased work of breathing patient had to be placed back on full support on the ventilator today as chest x-ray was performed and showed continued left-sided pleural effusion.  Medications: Reviewed on Rounds  Physical Exam:  Vitals: Pulse 82 respirations 12 BP 113/66 O2 sat 93% temp 97.3  Ventilator Settings vital mode AC VC rate of 26 tidal line 450 PEEP of 8 FiO2 1%  . General: Comfortable at this time . Eyes: Grossly normal lids, irises & conjunctiva . ENT: grossly tongue is normal . Neck: no obvious mass . Cardiovascular: S1 S2 normal no gallop . Respiratory: Coarse breath sounds . Abdomen: soft . Skin: no rash seen on limited exam . Musculoskeletal: not rigid . Psychiatric:unable to assess . Neurologic: no seizure no involuntary movements         Lab Data:   Basic Metabolic Panel: Recent Labs  Lab 01/17/19 1050 01/18/19 0511 01/19/19 0542 01/20/19 0520 01/21/19 1112  NA 135 133* 133* 134* 133*  K 3.6 3.3* 3.8 3.7 4.0  CL 94* 94* 93* 91* 95*  CO2 30 31 32 34* 30  GLUCOSE 118* 136* 137* 116* 115*  BUN 35* 36* 36* 35* 27*  CREATININE 0.45* 0.45* 0.40* 0.53* 0.41*  CALCIUM 8.2* 7.9* 8.2* 8.1* 7.9*  MG  --   --  1.6* 1.6*  --     ABG: Recent Labs  Lab 01/21/19 1045  PHART 7.459*  PCO2ART 47.8  PO2ART 60.2*  HCO3 33.4*  O2SAT 90.9    Liver Function Tests: No results for input(s): AST, ALT, ALKPHOS, BILITOT, PROT, ALBUMIN in the last 168 hours. No results for input(s): LIPASE, AMYLASE in the last 168 hours. Recent  Labs  Lab 01/21/19 1112  AMMONIA 31    CBC: Recent Labs  Lab 01/15/19 0428 01/17/19 1234  WBC 10.0 13.9*  HGB 8.2* 9.6*  HCT 25.7* 29.5*  MCV 97.7 97.4  PLT 374 504*    Cardiac Enzymes: Recent Labs  Lab 01/21/19 1112  TROPONINI <0.03    BNP (last 3 results) No results for input(s): BNP in the last 8760 hours.  ProBNP (last 3 results) No results for input(s): PROBNP in the last 8760 hours.  Radiological Exams: Dg Chest Port 1 View  Result Date: 01/21/2019 CLINICAL DATA:  Acute on chronic respiratory failure. EXAM: PORTABLE CHEST 1 VIEW COMPARISON:  January 17, 2019 FINDINGS: The tracheostomy tube is stable. A feeding tube terminates below today's film. No pneumothorax. A moderate left-sided pleural effusion with underlying opacity is stable. The heart and mediastinum are shifted to the left, probably due to patient rotation and left-sided atelectasis. The right lung is clear. No other acute abnormalities. IMPRESSION: Moderate left-sided pleural effusion with underlying opacity, larger in the interval. No other abnormalities. Electronically Signed   By: Dorise Bullion III M.D   On: 01/21/2019 11:18    Assessment/Plan Active Problems:   Acute on chronic respiratory failure with hypoxia ([place])   Acute renal failure due to tubular necrosis (HCC)   Strangulated inguinal hernia   Necrotizing pneumonia ([place])  Paroxysmal atrial fibrillation with rapid ventricular response (HCC)   ARDS (adult respiratory distress syndrome) (HCC)   Severe sepsis (HCC)   1. Acute on chronic respiratory failure hypoxia patient back on assist control requiring 100% FiO2 at this time.  Continue supportive measures and pulmonary toilet. 2. Acute renal failure follow labs hydrate as necessary. 3. Strangulated inguinal hernia status post repair 4. Necrotizing pneumonia we will continue to monitor 5. ARDS oxygen requirements have worsened significantly when going to try heated high flow if this does not  help then will need to be placed back on the ventilator 6. Paroxysmal atrial fibrillation right now rate controlled continue supportive care 7. Severe sepsis hemodynamically stable continue to monitor   I have personally seen and evaluated the patient, evaluated laboratory and imaging results, formulated the assessment and plan and placed orders. The Patient requires high complexity decision making for assessment and support.  Case was discussed on Rounds with the Respiratory Therapy Staff  Yevonne PaxSaadat A Jessie Cowher, MD [facility] Pulmonary Critical Care Medicine Sleep Medicine

## 2019-01-21 NOTE — Progress Notes (Signed)
IR requested by Dr. Harlow Mares for possible image-guided percutaneous gastrostomy tube placement.  Patient with history of open hernia repair complicated by severe ARDS and sepsis. Currently on trach/vent with NGT.  IR was asked to consult for above procedure 01/06/2019. At that time, it was recommended against procedure due to open midline abdominal wound (with drainage requiring packing extending into upper abdomen). It was recommended for surgical consult for gastrostomy tube placement.  Surgery was consulted who recommended IR consultation again. Case was reviewed again today by Dr. Annamaria Boots who states above, and in addition patient has high colon making procedure not advisable for pull through method in IR. Again, recommend surgical consultation. Dr. Harlow Mares made aware of above.  Please call IR with questions/concerns.   Bea Graff Louk, PA-C 01/21/2019, 12:07 PM

## 2019-01-22 ENCOUNTER — Other Ambulatory Visit (HOSPITAL_COMMUNITY): Payer: Self-pay

## 2019-01-22 DIAGNOSIS — N170 Acute kidney failure with tubular necrosis: Secondary | ICD-10-CM | POA: Diagnosis not present

## 2019-01-22 DIAGNOSIS — J9621 Acute and chronic respiratory failure with hypoxia: Secondary | ICD-10-CM | POA: Diagnosis not present

## 2019-01-22 DIAGNOSIS — J80 Acute respiratory distress syndrome: Secondary | ICD-10-CM | POA: Diagnosis not present

## 2019-01-22 DIAGNOSIS — J850 Gangrene and necrosis of lung: Secondary | ICD-10-CM | POA: Diagnosis not present

## 2019-01-22 LAB — BASIC METABOLIC PANEL
Anion gap: 9 (ref 5–15)
BUN: 37 mg/dL — ABNORMAL HIGH (ref 8–23)
CO2: 32 mmol/L (ref 22–32)
Calcium: 8.1 mg/dL — ABNORMAL LOW (ref 8.9–10.3)
Chloride: 90 mmol/L — ABNORMAL LOW (ref 98–111)
Creatinine, Ser: 0.54 mg/dL — ABNORMAL LOW (ref 0.61–1.24)
GFR calc Af Amer: >60 mL/min (ref >60)
GFR calc non Af Amer: >60 mL/min (ref >60)
Glucose, Bld: 132 mg/dL — ABNORMAL HIGH (ref 70–99)
Potassium: 3.6 mmol/L (ref 3.5–5.1)
Sodium: 131 mmol/L — ABNORMAL LOW (ref 135–145)

## 2019-01-22 LAB — AMMONIA: Ammonia: 45 umol/L — ABNORMAL HIGH (ref 9–35)

## 2019-01-22 NOTE — Progress Notes (Addendum)
[facility]   PULMONARY CRITICAL CARE SERVICE  PROGRESS NOTE  Date of Service: 01/22/2019  Benjamin [name]  ZOX:[id number]  DOB: [birth year]   DOA: 12/14/2018  Referring Physician: Carron CurieAli Hijazi, MD  HPI: Benjamin LedgerRick E Eccleston is a 63 y.o. male seen for follow up of Acute on Chronic Respiratory Failure.  Patient continues on full support with a rate of 26 and an FiO2 of 80%.  We will continue to wean FiO2 as patient can tolerate.  Medications: Reviewed on Rounds  Physical Exam:  Vitals: Pulse 98 respirations 19 BP 111/74 O2 sat 98% temp 97.1  Ventilator Settings ventilator mode AC VC rate 26 tidal volume 450 PEEP of 8 FiO2 of 80%  . General: Comfortable at this time . Eyes: Grossly normal lids, irises & conjunctiva . ENT: grossly tongue is normal . Neck: no obvious mass . Cardiovascular: S1 S2 normal no gallop . Respiratory: Coarse breath sounds . Abdomen: soft . Skin: no rash seen on limited exam . Musculoskeletal: not rigid . Psychiatric:unable to assess . Neurologic: no seizure no involuntary movements         Lab Data:   Basic Metabolic Panel: Recent Labs  Lab 01/18/19 0511 01/19/19 0542 01/20/19 0520 01/21/19 1112 01/22/19 0650  NA 133* 133* 134* 133* 131*  K 3.3* 3.8 3.7 4.0 3.6  CL 94* 93* 91* 95* 90*  CO2 31 32 34* 30 32  GLUCOSE 136* 137* 116* 115* 132*  BUN 36* 36* 35* 27* 37*  CREATININE 0.45* 0.40* 0.53* 0.41* 0.54*  CALCIUM 7.9* 8.2* 8.1* 7.9* 8.1*  MG  --  1.6* 1.6*  --   --     ABG: Recent Labs  Lab 01/21/19 1045  PHART 7.459*  PCO2ART 47.8  PO2ART 60.2*  HCO3 33.4*  O2SAT 90.9    Liver Function Tests: No results for input(s): AST, ALT, ALKPHOS, BILITOT, PROT, ALBUMIN in the last 168 hours. No results for input(s): LIPASE, AMYLASE in the last 168 hours. Recent Labs  Lab 01/21/19 1112 01/22/19 0650  AMMONIA 31 45*    CBC: Recent Labs  Lab 01/17/19 1234  WBC 13.9*  HGB 9.6*  HCT  29.5*  MCV 97.4  PLT 504*    Cardiac Enzymes: Recent Labs  Lab 01/21/19 1112  TROPONINI <0.03    BNP (last 3 results) No results for input(s): BNP in the last 8760 hours.  ProBNP (last 3 results) No results for input(s): PROBNP in the last 8760 hours.  Radiological Exams: Dg Chest Port 1 View  Result Date: 01/22/2019 CLINICAL DATA:  Respiratory distress EXAM: PORTABLE CHEST 1 VIEW COMPARISON:  January 21, 2019 FINDINGS: The tracheostomy tube projects over the tracheal air column. A feeding tube terminates below today's film. The left-sided pleural effusion and underlying opacity are improved. Left retrocardiac opacity and a small effusion do remain. Prominent skin folds overlie the lateral right chest with lung markings on each side. No pneumothorax identified. There may be a tiny right pleural effusion as well with blunting of the costophrenic angle, similar in the interval. The cardiomediastinal silhouette is stable. There is some sort of line in the right upper extremity, unchanged since at least January 13, 2019. Recommend clinical correlation. Emphysematous changes. No other acute abnormalities identified. IMPRESSION: 1. Emphysematous changes in the lungs. 2. Support apparatus as above. 3. The left-sided pleural effusion and associated left basilar opacity are improved. A small right effusion remains. Electronically Signed   By: Gerome Samavid  Williams III  M.D   On: 01/22/2019 08:02   Dg Chest Port 1 View  Result Date: 01/21/2019 CLINICAL DATA:  Acute on chronic respiratory failure. EXAM: PORTABLE CHEST 1 VIEW COMPARISON:  January 17, 2019 FINDINGS: The tracheostomy tube is stable. A feeding tube terminates below today's film. No pneumothorax. A moderate left-sided pleural effusion with underlying opacity is stable. The heart and mediastinum are shifted to the left, probably due to patient rotation and left-sided atelectasis. The right lung is clear. No other acute abnormalities. IMPRESSION: Moderate  left-sided pleural effusion with underlying opacity, larger in the interval. No other abnormalities. Electronically Signed   By: Dorise Bullion III M.D   On: 01/21/2019 11:18    Assessment/Plan Active Problems:   Acute on chronic respiratory failure with hypoxia ([place])   Acute renal failure due to tubular necrosis (HCC)   Strangulated inguinal hernia   Necrotizing pneumonia (HCC)   Paroxysmal atrial fibrillation with rapid ventricular response (HCC)   ARDS (adult respiratory distress syndrome) (HCC)   Severe sepsis ([place])   1. Acute on chronic respiratory failure hypoxia patient back on assist control requiring 80% FiO2 at this time.  Continue supportive measures and pulmonary toilet.  Continue to wean as per protocol. 2. Acute renal failure follow labs hydrate as necessary. 3. Strangulated inguinal hernia status post repair 4. Necrotizing pneumonia we will continue to monitor 5. ARDS oxygen requirements have worsened significantly when going to try heated high flow if this does not help then will need to be placed back on the ventilator 6. Paroxysmal atrial fibrillation right now rate controlled continue supportive care 7. Severe sepsis hemodynamically stable continue to monitor   I have personally seen and evaluated the patient, evaluated laboratory and imaging results, formulated the assessment and plan and placed orders. The Patient requires high complexity decision making for assessment and support.  Case was discussed on Rounds with the Respiratory Therapy Staff  Allyne Gee, MD [facility] Pulmonary Critical Care Medicine Sleep Medicine

## 2019-01-23 DIAGNOSIS — N170 Acute kidney failure with tubular necrosis: Secondary | ICD-10-CM | POA: Diagnosis not present

## 2019-01-23 DIAGNOSIS — J80 Acute respiratory distress syndrome: Secondary | ICD-10-CM | POA: Diagnosis not present

## 2019-01-23 DIAGNOSIS — J850 Gangrene and necrosis of lung: Secondary | ICD-10-CM | POA: Diagnosis not present

## 2019-01-23 DIAGNOSIS — J9621 Acute and chronic respiratory failure with hypoxia: Secondary | ICD-10-CM | POA: Diagnosis not present

## 2019-01-23 LAB — BASIC METABOLIC PANEL
Anion gap: 11 (ref 5–15)
BUN: 28 mg/dL — ABNORMAL HIGH (ref 8–23)
CO2: 32 mmol/L (ref 22–32)
Calcium: 8 mg/dL — ABNORMAL LOW (ref 8.9–10.3)
Chloride: 89 mmol/L — ABNORMAL LOW (ref 98–111)
Creatinine, Ser: 0.48 mg/dL — ABNORMAL LOW (ref 0.61–1.24)
GFR calc Af Amer: >60 mL/min (ref >60)
GFR calc non Af Amer: >60 mL/min (ref >60)
Glucose, Bld: 89 mg/dL (ref 70–99)
Potassium: 3.4 mmol/L — ABNORMAL LOW (ref 3.5–5.1)
Sodium: 132 mmol/L — ABNORMAL LOW (ref 135–145)

## 2019-01-23 LAB — AMMONIA: Ammonia: <9 umol/L — ABNORMAL LOW (ref 9–35)

## 2019-01-23 NOTE — Progress Notes (Addendum)
[facility]   PULMONARY CRITICAL CARE SERVICE  PROGRESS NOTE  Date of Service: 01/23/2019  Jillyn LedgerRick E Bunton  ZOX:[id number]  DOB: Jul 12, 1956   DOA: 12/14/2018  Referring Physician: Carron CurieAli Hijazi, MD  HPI: Jillyn LedgerRick E Opiela is a 63 y.o. male seen for follow up of Acute on Chronic Respiratory Failure.  Patient remains on full support on the ventilator at this time satting well with no distress.  Medications: Reviewed on Rounds  Physical Exam:  Vitals: Pulse 82 respirations 26 BP 106/72 O2 sat 9 6% temp 98.7  Ventilator Settings ventilator mode AC VC rate of 26 tidal volume 450 PEEP of 8 FiO2 of 80%  . General: Comfortable at this time . Eyes: Grossly normal lids, irises & conjunctiva . ENT: grossly tongue is normal . Neck: no obvious mass . Cardiovascular: S1 S2 normal no gallop . Respiratory: Coarse breath sounds . Abdomen: soft . Skin: no rash seen on limited exam . Musculoskeletal: not rigid . Psychiatric:unable to assess . Neurologic: no seizure no involuntary movements         Lab Data:   Basic Metabolic Panel: Recent Labs  Lab 01/19/19 0542 01/20/19 0520 01/21/19 1112 01/22/19 0650 01/23/19 0848  NA 133* 134* 133* 131* 132*  K 3.8 3.7 4.0 3.6 3.4*  CL 93* 91* 95* 90* 89*  CO2 32 34* 30 32 32  GLUCOSE 137* 116* 115* 132* 89  BUN 36* 35* 27* 37* 28*  CREATININE 0.40* 0.53* 0.41* 0.54* 0.48*  CALCIUM 8.2* 8.1* 7.9* 8.1* 8.0*  MG 1.6* 1.6*  --   --   --     ABG: Recent Labs  Lab 01/21/19 1045  PHART 7.459*  PCO2ART 47.8  PO2ART 60.2*  HCO3 33.4*  O2SAT 90.9    Liver Function Tests: No results for input(s): AST, ALT, ALKPHOS, BILITOT, PROT, ALBUMIN in the last 168 hours. No results for input(s): LIPASE, AMYLASE in the last 168 hours. Recent Labs  Lab 01/21/19 1112 01/22/19 0650 01/23/19 0848  AMMONIA 31 45* <9*    CBC: Recent Labs  Lab 01/17/19 1234  WBC 13.9*  HGB 9.6*  HCT 29.5*  MCV 97.4   PLT 504*    Cardiac Enzymes: Recent Labs  Lab 01/21/19 1112  TROPONINI <0.03    BNP (last 3 results) No results for input(s): BNP in the last 8760 hours.  ProBNP (last 3 results) No results for input(s): PROBNP in the last 8760 hours.  Radiological Exams: Dg Chest Port 1 View  Result Date: 01/22/2019 CLINICAL DATA:  Respiratory distress EXAM: PORTABLE CHEST 1 VIEW COMPARISON:  January 21, 2019 FINDINGS: The tracheostomy tube projects over the tracheal air column. A feeding tube terminates below today's film. The left-sided pleural effusion and underlying opacity are improved. Left retrocardiac opacity and a small effusion do remain. Prominent skin folds overlie the lateral right chest with lung markings on each side. No pneumothorax identified. There may be a tiny right pleural effusion as well with blunting of the costophrenic angle, similar in the interval. The cardiomediastinal silhouette is stable. There is some sort of line in the right upper extremity, unchanged since at least January 13, 2019. Recommend clinical correlation. Emphysematous changes. No other acute abnormalities identified. IMPRESSION: 1. Emphysematous changes in the lungs. 2. Support apparatus as above. 3. The left-sided pleural effusion and associated left basilar opacity are improved. A small right effusion remains. Electronically Signed   By: Gerome Samavid  Williams III M.D   On: 01/22/2019  08:02    Assessment/Plan Active Problems:   Acute on chronic respiratory failure with hypoxia (HCC)   Acute renal failure due to tubular necrosis (HCC)   Strangulated inguinal hernia   Necrotizing pneumonia (HCC)   Paroxysmal atrial fibrillation with rapid ventricular response (HCC)   ARDS (adult respiratory distress syndrome) (HCC)   Severe sepsis ([place])   1. Acute on chronic respiratory failure hypoxiapatient back on assist control requiring 70% FiO2 at this time. Continue supportive measures and pulmonary toilet.  Continue to wean  as per protocol. 2. Acute renal failure follow labs hydrate as necessary. 3. Strangulated inguinal hernia status post repair 4. Necrotizing pneumonia we will continue to monitor 5. ARDS oxygen requirements have worsened significantly when going to try heated high flow if this does not help then will need to be placed back on the ventilator 6. Paroxysmal atrial fibrillation right now rate controlled continue supportive care 7. Severe sepsis hemodynamically stable continue to monitor   I have personally seen and evaluated the patient, evaluated laboratory and imaging results, formulated the assessment and plan and placed orders. The Patient requires high complexity decision making for assessment and support.  Case was discussed on Rounds with the Respiratory Therapy Staff  Allyne Gee, MD [facility] Pulmonary Critical Care Medicine Sleep Medicine

## 2019-01-24 ENCOUNTER — Other Ambulatory Visit (HOSPITAL_COMMUNITY): Payer: Self-pay

## 2019-01-24 DIAGNOSIS — J9621 Acute and chronic respiratory failure with hypoxia: Secondary | ICD-10-CM | POA: Diagnosis not present

## 2019-01-24 DIAGNOSIS — J80 Acute respiratory distress syndrome: Secondary | ICD-10-CM | POA: Diagnosis not present

## 2019-01-24 DIAGNOSIS — J850 Gangrene and necrosis of lung: Secondary | ICD-10-CM | POA: Diagnosis not present

## 2019-01-24 DIAGNOSIS — N170 Acute kidney failure with tubular necrosis: Secondary | ICD-10-CM | POA: Diagnosis not present

## 2019-01-24 LAB — BASIC METABOLIC PANEL
Anion gap: 11 (ref 5–15)
BUN: 27 mg/dL — ABNORMAL HIGH (ref 8–23)
CO2: 32 mmol/L (ref 22–32)
Calcium: 8 mg/dL — ABNORMAL LOW (ref 8.9–10.3)
Chloride: 92 mmol/L — ABNORMAL LOW (ref 98–111)
Creatinine, Ser: 0.53 mg/dL — ABNORMAL LOW (ref 0.61–1.24)
GFR calc Af Amer: >60 mL/min (ref >60)
GFR calc non Af Amer: >60 mL/min (ref >60)
Glucose, Bld: 79 mg/dL (ref 70–99)
Potassium: 3.5 mmol/L (ref 3.5–5.1)
Sodium: 135 mmol/L (ref 135–145)

## 2019-01-24 LAB — AMMONIA: Ammonia: 26 umol/L (ref 9–35)

## 2019-01-24 MED ORDER — IOHEXOL 350 MG/ML SOLN
100.0000 mL | Freq: Once | INTRAVENOUS | Status: AC | PRN
  Administered 2019-01-24: 100 mL via INTRAVENOUS

## 2019-01-24 NOTE — Progress Notes (Addendum)
Pulmonary Critical Care Medicine [place]   PULMONARY CRITICAL CARE SERVICE  PROGRESS NOTE  Date of Service: 01/24/2019  [place] DUCRE  ZSW:[id number]  DOB: 05/06/1956   DOA: 12/14/2018  Referring Physician: Merton Border, MD  HPI: Benjamin [name] is a 63 y.o. male seen for follow up of Acute on Chronic Respiratory Failure.  Patient remained full support on the ventilator at this time.  Satting well with no distress currently.  Patient had a CT chest today that was negative for pulmonary embolism.  Full results report is attached to this note.  Medications: Reviewed on Rounds  Physical Exam:  Vitals: Pulse 95 respirations 29 BP 113/79 O2 sat of percent temp 97.4  Ventilator Settings Mode AC VC rate of 26 tidal volume 50 PEEP of 8 FiO2 of 80%  . General: Comfortable at this time . Eyes: Grossly normal lids, irises & conjunctiva . ENT: grossly tongue is normal . Neck: no obvious mass . Cardiovascular: S1 S2 normal no gallop . Respiratory: Coarse breath sounds . Abdomen: soft . Skin: no rash seen on limited exam . Musculoskeletal: not rigid . Psychiatric:unable to assess . Neurologic: no seizure no involuntary movements         Lab Data:   Basic Metabolic Panel: Recent Labs  Lab 01/19/19 0542 01/20/19 0520 01/21/19 1112 01/22/19 0650 01/23/19 0848 01/24/19 1057  NA 133* 134* 133* 131* 132* 135  K 3.8 3.7 4.0 3.6 3.4* 3.5  CL 93* 91* 95* 90* 89* 92*  CO2 32 34* 30 32 32 32  GLUCOSE 137* 116* 115* 132* 89 79  BUN 36* 35* 27* 37* 28* 27*  CREATININE 0.40* 0.53* 0.41* 0.54* 0.48* 0.53*  CALCIUM 8.2* 8.1* 7.9* 8.1* 8.0* 8.0*  MG 1.6* 1.6*  --   --   --   --     ABG: Recent Labs  Lab 01/21/19 1045  PHART 7.459*  PCO2ART 47.8  PO2ART 60.2*  HCO3 33.4*  O2SAT 90.9    Liver Function Tests: No results for input(s): AST, ALT, ALKPHOS, BILITOT, PROT, ALBUMIN in the last 168 hours. No results for input(s): LIPASE, AMYLASE in the last 168  hours. Recent Labs  Lab 01/21/19 1112 01/22/19 0650 01/23/19 0848 01/24/19 1057  AMMONIA 31 45* <9* 26    CBC: No results for input(s): WBC, NEUTROABS, HGB, HCT, MCV, PLT in the last 168 hours.  Cardiac Enzymes: Recent Labs  Lab 01/21/19 1112  TROPONINI <0.03    BNP (last 3 results) No results for input(s): BNP in the last 8760 hours.  ProBNP (last 3 results) No results for input(s): PROBNP in the last 8760 hours.  Radiological Exams: Ct Angio Chest Pe W Or Wo Contrast  Result Date: 01/24/2019 CLINICAL DATA:  Acute on chronic respiratory failure. EXAM: CT ANGIOGRAPHY CHEST WITH CONTRAST TECHNIQUE: Multidetector CT imaging of the chest was performed using the standard protocol during bolus administration of intravenous contrast. Multiplanar CT image reconstructions and MIPs were obtained to evaluate the vascular anatomy. CONTRAST:  118mL OMNIPAQUE IOHEXOL 350 MG/ML SOLN COMPARISON:  01/05/2019 and 12/20/2018. FINDINGS: Cardiovascular: Negative for pulmonary embolus. Atherosclerotic calcification of the aorta. Heart size normal. No pericardial effusion. Mediastinum/Nodes: Low left paratracheal and AP window lymph nodes are not enlarged by CT size criteria. No definite hilar or axillary lymph nodes. Esophagus is grossly unremarkable. Lungs/Pleura: Centrilobular emphysema. There has been some interval improvement in peribronchovascular nodular consolidation and mucoid impaction bilaterally, without complete resolution. Left lower lobe collapse/consolidation appears similar. Debris  is seen in left upper and left lower lobe bronchi. Left pleural effusion is moderate in size and has increased slightly from 01/05/2019. Fluid-filled bronchi in the left lower lobe. Upper Abdomen: Visualized portions of the liver, adrenal glands, kidneys, spleen, pancreas, stomach and bowel are unremarkable. 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 Musculoskeletal: Degenerative changes in the spine.  Review of the MIP images confirms the above findings. IMPRESSION: 1. Negative for pulmonary embolus. 2. Improving bilateral peribronchovascular nodularity and consolidation, without complete resolution. 3. Fluid-filled bronchi in the left lower lobe with collapse/consolidation, which may be due to pneumonia. 4. Moderate left pleural effusion, increased from 01/05/2019. 5. Splenic artery aneurysm. 6.  Aortic atherosclerosis (ICD10-170.0). 7.  Emphysema (ICD10-J43.9). Electronically Signed   By: Leanna BattlesMelinda  Blietz M.D.   On: 01/24/2019 13:16   Dg Chest Port 1 View  Result Date: 01/24/2019 CLINICAL DATA:  Respiratory distress EXAM: PORTABLE CHEST 1 VIEW COMPARISON:  01/22/2019 chest radiograph. FINDINGS: Tracheostomy tube terminates over the tracheal air column just below the thoracic inlet. Left rotated chest radiograph. Stable cardiomediastinal silhouette with normal heart size. No pneumothorax. Stable small left pleural effusion. Stable left lung base opacity. Hyperinflated lungs. No pulmonary edema. IMPRESSION: 1. Stable small left pleural effusion and left basilar lung opacity, favor atelectasis. 2. Hyperinflated lungs, suggesting COPD. Electronically Signed   By: Delbert PhenixJason A Poff M.D.   On: 01/24/2019 07:58    Assessment/Plan Active Problems:   Acute on chronic respiratory failure with hypoxia (HCC)   Acute renal failure due to tubular necrosis (HCC)   Strangulated inguinal hernia   Necrotizing pneumonia (HCC)   Paroxysmal atrial fibrillation with rapid ventricular response (HCC)   ARDS (adult respiratory distress syndrome) (HCC)   Severe sepsis (HCC)   1. Acute on chronic respiratory failure hypoxiapatient back on assist control requiring80%FiO2 at this time. Continue supportive measures and pulmonary toilet.Continue to wean as per protocol. 2. Acute renal failure follow labs hydrate as necessary. 3. Strangulated inguinal hernia status post repair 4. Necrotizing pneumonia we will continue to  monitor 5. ARDS oxygen requirements have worsened significantly when going to try heated high flow if this does not help then will need to be placed back on the ventilator 6. Paroxysmal atrial fibrillation right now rate controlled continue supportive care 7. Severe sepsis hemodynamically stable continue to monitor   I have personally seen and evaluated the patient, evaluated laboratory and imaging results, formulated the assessment and plan and placed orders. The Patient requires high complexity decision making for assessment and support.  Case was discussed on Rounds with the Respiratory Therapy Staff  Yevonne PaxSaadat A Keaghan [place]on, MD [facility] Pulmonary Critical Care Medicine Sleep Medicine \

## 2019-01-25 DIAGNOSIS — N170 Acute kidney failure with tubular necrosis: Secondary | ICD-10-CM | POA: Diagnosis not present

## 2019-01-25 DIAGNOSIS — J9621 Acute and chronic respiratory failure with hypoxia: Secondary | ICD-10-CM | POA: Diagnosis not present

## 2019-01-25 DIAGNOSIS — J80 Acute respiratory distress syndrome: Secondary | ICD-10-CM | POA: Diagnosis not present

## 2019-01-25 DIAGNOSIS — J850 Gangrene and necrosis of lung: Secondary | ICD-10-CM | POA: Diagnosis not present

## 2019-01-25 LAB — PHOSPHORUS: Phosphorus: 3.3 mg/dL (ref 2.5–4.6)

## 2019-01-25 LAB — AMMONIA: Ammonia: 46 umol/L — ABNORMAL HIGH (ref 9–35)

## 2019-01-25 LAB — MAGNESIUM: Magnesium: 1.5 mg/dL — ABNORMAL LOW (ref 1.7–2.4)

## 2019-01-25 LAB — VANCOMYCIN, TROUGH: Vancomycin Tr: 10 ug/mL — ABNORMAL LOW (ref 15–20)

## 2019-01-25 LAB — TRIGLYCERIDES: Triglycerides: 87 mg/dL (ref <150)

## 2019-01-25 NOTE — Progress Notes (Addendum)
[facility]   PULMONARY CRITICAL CARE SERVICE  PROGRESS NOTE  Date of Service: 01/25/2019  Benjamin LedgerRick E Riggan  ZOX:[id number]  DOB: 1955-11-07   DOA: 12/14/2018  Referring Physician: Carron CurieAli Hijazi, MD  HPI: Benjamin [name] is a 63 y.o. male seen for follow up of Acute on Chronic Respiratory Failure.  Patient remains on heated high flow nasal cannula 20 L and 45% FiO2 satting well with no fever or distress noted.  Medications: Reviewed on Rounds  Physical Exam:  Vitals: Pulse 80 respirations 20 BP 112/55 O2 sat 100% temp 98.7  Ventilator Settings heated high flow 20 L 45% FiO2  . General: Comfortable at this time . Eyes: Grossly normal lids, irises & conjunctiva . ENT: grossly tongue is normal . Neck: no obvious mass . Cardiovascular: S1 S2 normal no gallop . Respiratory: Coarse breath sounds . Abdomen: soft . Skin: no rash seen on limited exam . Musculoskeletal: not rigid . Psychiatric:unable to assess . Neurologic: no seizure no involuntary movements         Lab Data:   Basic Metabolic Panel: Recent Labs  Lab 01/19/19 0542 01/20/19 0520 01/21/19 1112 01/22/19 0650 01/23/19 0848 01/24/19 1057 01/25/19 0749  NA 133* 134* 133* 131* 132* 135  --   K 3.8 3.7 4.0 3.6 3.4* 3.5  --   CL 93* 91* 95* 90* 89* 92*  --   CO2 32 34* 30 32 32 32  --   GLUCOSE 137* 116* 115* 132* 89 79  --   BUN 36* 35* 27* 37* 28* 27*  --   CREATININE 0.40* 0.53* 0.41* 0.54* 0.48* 0.53*  --   CALCIUM 8.2* 8.1* 7.9* 8.1* 8.0* 8.0*  --   MG 1.6* 1.6*  --   --   --   --  1.5*  PHOS  --   --   --   --   --   --  3.3    ABG: Recent Labs  Lab 01/21/19 1045  PHART 7.459*  PCO2ART 47.8  PO2ART 60.2*  HCO3 33.4*  O2SAT 90.9    Liver Function Tests: No results for input(s): AST, ALT, ALKPHOS, BILITOT, PROT, ALBUMIN in the last 168 hours. No results for input(s): LIPASE, AMYLASE in the last 168 hours. Recent Labs  Lab 01/21/19 1112  01/22/19 0650 01/23/19 0848 01/24/19 1057 01/25/19 0749  AMMONIA 31 45* <9* 26 46*    CBC: No results for input(s): WBC, NEUTROABS, HGB, HCT, MCV, PLT in the last 168 hours.  Cardiac Enzymes: Recent Labs  Lab 01/21/19 1112  TROPONINI <0.03    BNP (last 3 results) No results for input(s): BNP in the last 8760 hours.  ProBNP (last 3 results) No results for input(s): PROBNP in the last 8760 hours.  Radiological Exams: Dg Abd 1 View  Result Date: 01/24/2019 CLINICAL DATA:  PICC line placement EXAM: ABDOMEN - 1 VIEW COMPARISON:  12/27/2018 FINDINGS: The patient's left femoral approach PICC line terminates at the L1 level, approximately 5 cm from the cavoatrial junction. The bowel gas pattern is nonspecific and nonobstructive. Contrast is noted within the kidneys from prior contrast enhanced examination. The osseous structures are stable from prior study. IMPRESSION: PICC line as above.  Nonobstructive bowel gas pattern. Electronically Signed   By: Katherine Mantlehristopher  Green M.D.   On: 01/24/2019 18:56   Ct Angio Chest Pe W Or Wo Contrast  Result Date: 01/24/2019 CLINICAL DATA:  Acute on chronic respiratory failure. EXAM: CT ANGIOGRAPHY  CHEST WITH CONTRAST TECHNIQUE: Multidetector CT imaging of the chest was performed using the standard protocol during bolus administration of intravenous contrast. Multiplanar CT image reconstructions and MIPs were obtained to evaluate the vascular anatomy. CONTRAST:  173mL OMNIPAQUE IOHEXOL 350 MG/ML SOLN COMPARISON:  01/05/2019 and 12/20/2018. FINDINGS: Cardiovascular: Negative for pulmonary embolus. Atherosclerotic calcification of the aorta. Heart size normal. No pericardial effusion. Mediastinum/Nodes: Low left paratracheal and AP window lymph nodes are not enlarged by CT size criteria. No definite hilar or axillary lymph nodes. Esophagus is grossly unremarkable. Lungs/Pleura: Centrilobular emphysema. There has been some interval improvement in  peribronchovascular nodular consolidation and mucoid impaction bilaterally, without complete resolution. Left lower lobe collapse/consolidation appears similar. Debris is seen in left upper and left lower lobe bronchi. Left pleural effusion is moderate in size and has increased slightly from 01/05/2019. Fluid-filled bronchi in the left lower lobe. Upper Abdomen: Visualized portions of the liver, adrenal glands, kidneys, spleen, pancreas, stomach and bowel are unremarkable. 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 Musculoskeletal: Degenerative changes in the spine. Review of the MIP images confirms the above findings. IMPRESSION: 1. Negative for pulmonary embolus. 2. Improving bilateral peribronchovascular nodularity and consolidation, without complete resolution. 3. Fluid-filled bronchi in the left lower lobe with collapse/consolidation, which may be due to pneumonia. 4. Moderate left pleural effusion, increased from 01/05/2019. 5. Splenic artery aneurysm. 6.  Aortic atherosclerosis (ICD10-170.0). 7.  Emphysema (ICD10-J43.9). Electronically Signed   By: Lorin Picket M.D.   On: 01/24/2019 13:16   Dg Chest Port 1 View  Result Date: 01/24/2019 CLINICAL DATA:  Respiratory distress EXAM: PORTABLE CHEST 1 VIEW COMPARISON:  01/22/2019 chest radiograph. FINDINGS: Tracheostomy tube terminates over the tracheal air column just below the thoracic inlet. Left rotated chest radiograph. Stable cardiomediastinal silhouette with normal heart size. No pneumothorax. Stable small left pleural effusion. Stable left lung base opacity. Hyperinflated lungs. No pulmonary edema. IMPRESSION: 1. Stable small left pleural effusion and left basilar lung opacity, favor atelectasis. 2. Hyperinflated lungs, suggesting COPD. Electronically Signed   By: Ilona Sorrel M.D.   On: 01/24/2019 07:58    Assessment/Plan Active Problems:   Acute on chronic respiratory failure with hypoxia (HCC)   Acute renal failure due to tubular  necrosis (HCC)   Strangulated inguinal hernia   Necrotizing pneumonia (HCC)   Paroxysmal atrial fibrillation with rapid ventricular response (HCC)   ARDS (adult respiratory distress syndrome) (HCC)   Severe sepsis ([place])   1. Acute on chronic respiratory failure hypoxiapatient is back on heated high flow nasal cannula today 20 L and 45% FiO2 satting well at this time.  We will continue pulmonary toilet and supportive measures.  Continue to wean per protocol. 2. Acute renal failure follow labs hydrate as necessary. 3. Strangulated inguinal hernia status post repair 4. Necrotizing pneumonia we will continue to monitor 5. ARDS oxygen requirements have worsened significantly when going to try heated high flow if this does not help then will need to be placed back on the ventilator 6. Paroxysmal atrial fibrillation right now rate controlled continue supportive care 7. Severe sepsis hemodynamically stable continue to monitor   I have personally seen and evaluated the patient, evaluated laboratory and imaging results, formulated the assessment and plan and placed orders. The Patient requires high complexity decision making for assessment and support.  Case was discussed on Rounds with the Respiratory Therapy Staff  Allyne Gee, MD [facility] Pulmonary Critical Care Medicine Sleep Medicine

## 2019-01-26 ENCOUNTER — Other Ambulatory Visit (HOSPITAL_COMMUNITY): Payer: Self-pay

## 2019-01-26 DIAGNOSIS — J9621 Acute and chronic respiratory failure with hypoxia: Secondary | ICD-10-CM | POA: Diagnosis not present

## 2019-01-26 DIAGNOSIS — N170 Acute kidney failure with tubular necrosis: Secondary | ICD-10-CM | POA: Diagnosis not present

## 2019-01-26 DIAGNOSIS — J850 Gangrene and necrosis of lung: Secondary | ICD-10-CM | POA: Diagnosis not present

## 2019-01-26 DIAGNOSIS — J80 Acute respiratory distress syndrome: Secondary | ICD-10-CM | POA: Diagnosis not present

## 2019-01-26 LAB — CBC
HCT: 30.5 % — ABNORMAL LOW (ref 39.0–52.0)
Hemoglobin: 10.1 g/dL — ABNORMAL LOW (ref 13.0–17.0)
MCH: 32.5 pg (ref 26.0–34.0)
MCHC: 33.1 g/dL (ref 30.0–36.0)
MCV: 98.1 fL (ref 80.0–100.0)
Platelets: 166 10*3/uL (ref 150–400)
RBC: 3.11 MIL/uL — ABNORMAL LOW (ref 4.22–5.81)
RDW: 16.2 % — ABNORMAL HIGH (ref 11.5–15.5)
WBC: 8.2 10*3/uL (ref 4.0–10.5)
nRBC: 0 % (ref 0.0–0.2)

## 2019-01-26 LAB — COMPREHENSIVE METABOLIC PANEL
ALT: 33 U/L (ref 0–44)
AST: 27 U/L (ref 15–41)
Albumin: 1.9 g/dL — ABNORMAL LOW (ref 3.5–5.0)
Alkaline Phosphatase: 43 U/L (ref 38–126)
Anion gap: 10 (ref 5–15)
BUN: 27 mg/dL — ABNORMAL HIGH (ref 8–23)
CO2: 31 mmol/L (ref 22–32)
Calcium: 7.7 mg/dL — ABNORMAL LOW (ref 8.9–10.3)
Chloride: 94 mmol/L — ABNORMAL LOW (ref 98–111)
Creatinine, Ser: 0.41 mg/dL — ABNORMAL LOW (ref 0.61–1.24)
GFR calc Af Amer: >60 mL/min (ref >60)
GFR calc non Af Amer: >60 mL/min (ref >60)
Glucose, Bld: 148 mg/dL — ABNORMAL HIGH (ref 70–99)
Potassium: 3.3 mmol/L — ABNORMAL LOW (ref 3.5–5.1)
Sodium: 135 mmol/L (ref 135–145)
Total Bilirubin: 0.7 mg/dL (ref 0.3–1.2)
Total Protein: 4.1 g/dL — ABNORMAL LOW (ref 6.5–8.1)

## 2019-01-26 LAB — VANCOMYCIN, TROUGH: Vancomycin Tr: 19 ug/mL (ref 15–20)

## 2019-01-26 LAB — AMMONIA: Ammonia: 33 umol/L (ref 9–35)

## 2019-01-26 LAB — MAGNESIUM: Magnesium: 1.7 mg/dL (ref 1.7–2.4)

## 2019-01-26 MED ORDER — IOHEXOL 300 MG/ML  SOLN
75.0000 mL | Freq: Once | INTRAMUSCULAR | Status: AC | PRN
  Administered 2019-01-26: 75 mL via INTRAVENOUS

## 2019-01-26 NOTE — Progress Notes (Addendum)
Pulmonary Critical Care Medicine [place]   PULMONARY CRITICAL CARE SERVICE  PROGRESS NOTE  Date of Service: 01/26/2019  Benjamin [name]  OFB:[id number]  DOB: 28-Mar-1956   DOA: 12/14/2018  Referring Physician: Merton Border, MD  HPI: Benjamin [name] is a 63 y.o. male seen for follow up of Acute on Chronic Respiratory Failure.  Patient was on heated high flow nasal cannula 30 L and 60% FiO2 for 2 hours before de-satting and is now back on full support on the ventilator currently requiring 75% O2.  Medications: Reviewed on Rounds  Physical Exam:  Vitals: Pulse 94 respirations 22 BP 159 O2 sat 95% temp 99.4  Ventilator Settings ventilator mode AC VC rate 26 tidal line 450 PEEP 8 FiO2 75%  . General: Comfortable at this time . Eyes: Grossly normal lids, irises & conjunctiva . ENT: grossly tongue is normal . Neck: no obvious mass . Cardiovascular: S1 S2 normal no gallop . Respiratory: Coarse breath sounds . Abdomen: soft . Skin: no rash seen on limited exam . Musculoskeletal: not rigid . Psychiatric:unable to assess . Neurologic: no seizure no involuntary movements         Lab Data:   Basic Metabolic Panel: Recent Labs  Lab 01/20/19 0520 01/21/19 1112 01/22/19 0650 01/23/19 0848 01/24/19 1057 01/25/19 0749 01/26/19 0657  NA 134* 133* 131* 132* 135  --  135  K 3.7 4.0 3.6 3.4* 3.5  --  3.3*  CL 91* 95* 90* 89* 92*  --  94*  CO2 34* 30 32 32 32  --  31  GLUCOSE 116* 115* 132* 89 79  --  148*  BUN 35* 27* 37* 28* 27*  --  27*  CREATININE 0.53* 0.41* 0.54* 0.48* 0.53*  --  0.41*  CALCIUM 8.1* 7.9* 8.1* 8.0* 8.0*  --  7.7*  MG 1.6*  --   --   --   --  1.5* 1.7  PHOS  --   --   --   --   --  3.3  --     ABG: Recent Labs  Lab 01/21/19 1045  PHART 7.459*  PCO2ART 47.8  PO2ART 60.2*  HCO3 33.4*  O2SAT 90.9    Liver Function Tests: Recent Labs  Lab 01/26/19 0657  AST 27  ALT 33  ALKPHOS 43  BILITOT 0.7  PROT 4.1*  ALBUMIN 1.9*   No  results for input(s): LIPASE, AMYLASE in the last 168 hours. Recent Labs  Lab 01/22/19 0650 01/23/19 0848 01/24/19 1057 01/25/19 0749 01/26/19 0657  AMMONIA 45* <9* 26 46* 33    CBC: Recent Labs  Lab 01/26/19 0657  WBC 8.2  HGB 10.1*  HCT 30.5*  MCV 98.1  PLT 166    Cardiac Enzymes: Recent Labs  Lab 01/21/19 1112  TROPONINI <0.03    BNP (last 3 results) No results for input(s): BNP in the last 8760 hours.  ProBNP (last 3 results) No results for input(s): PROBNP in the last 8760 hours.  Radiological Exams: Dg Abd 1 View  Result Date: 01/24/2019 CLINICAL DATA:  PICC line placement EXAM: ABDOMEN - 1 VIEW COMPARISON:  12/27/2018 FINDINGS: The patient's left femoral approach PICC line terminates at the L1 level, approximately 5 cm from the cavoatrial junction. The bowel gas pattern is nonspecific and nonobstructive. Contrast is noted within the kidneys from prior contrast enhanced examination. The osseous structures are stable from prior study. IMPRESSION: PICC line as above.  Nonobstructive bowel gas pattern. Electronically Signed  By: Katherine Mantlehristopher  Green M.D.   On: 01/24/2019 18:56   Ct Maxillofacial W Contrast  Result Date: 01/26/2019 CLINICAL DATA:  Right facial abscess. EXAM: CT MAXILLOFACIAL WITH CONTRAST TECHNIQUE: Multidetector CT imaging of the maxillofacial structures was performed with intravenous contrast. Multiplanar CT image reconstructions were also generated. CONTRAST:  75mL OMNIPAQUE IOHEXOL 300 MG/ML  SOLN COMPARISON:  None. FINDINGS: Osseous: No evidence of fracture or other focal lesion. The patient is edentulous. Orbits: Normal Sinuses: Complete opacification of the right frontal, ethmoid, sphenoid and maxillary sinuses. Left paranasal sinuses are clear except for a single opacified posterior ethmoid air cell. There is an extensive mastoid effusion on the right. Left mastoid is clear. Soft tissues: There may be mild soft tissue swelling of the right face  which could go along with cellulitis. No evidence of facial abscess or drainable collection. There is an ovoid enhancing structure along the inferior aspect of the superficial lobe of the right parotid measuring 14 mm in transverse dimension with a length 2.3 cm. This most likely represents an enlarged parotid node. The possibility that this could represent a parotid tumor does exist. Limited intracranial: Posterior fossa of fluid collection superior to the cerebellum of uncertain nature. This could represent an arachnoid cyst, but is not completely evaluated. IMPRESSION: Right-sided paranasal sinusitis with complete opacification. Right sided mastoid effusion. Possible right facial cellulitis. No evidence of abscess or drainable collection. 14 x 14 x 23 mm enhancing structure at the inferior parotid on the right that could represent a reactive parotid lymph or a parotid tumor. Cystic structure superior to the cerebellum could possibly represent an arachnoid cyst. This is not fully or definitively evaluated. Electronically Signed   By: Paulina FusiMark  Shogry M.D.   On: 01/26/2019 14:55    Assessment/Plan Active Problems:   Acute on chronic respiratory failure with hypoxia (HCC)   Acute renal failure due to tubular necrosis (HCC)   Strangulated inguinal hernia   Necrotizing pneumonia (HCC)   Paroxysmal atrial fibrillation with rapid ventricular response (HCC)   ARDS (adult respiratory distress syndrome) (HCC)   Severe sepsis (HCC)   1. Acute on chronic respiratory failure hypoxiapatient failed heated high flow nasal cannula today and is back on full support on the ventilator as above.  Continue supportive measures and pulmonary toilet.   2. Acute renal failure follow labs hydrate as necessary. 3. Strangulated inguinal hernia status post repair 4. Necrotizing pneumonia we will continue to monitor 5. ARDS oxygen requirements have worsened significantly when going to try heated high flow if this does not help  then will need to be placed back on the ventilator 6. Paroxysmal atrial fibrillation right now rate controlled continue supportive care 7. Severe sepsis hemodynamically stable continue to monitor   I have personally seen and evaluated the patient, evaluated laboratory and imaging results, formulated the assessment and plan and placed orders. The Patient requires high complexity decision making for assessment and support.  Case was discussed on Rounds with the Respiratory Therapy Staff  Yevonne PaxSaadat A Yaritzi Craun, MD [facility] Pulmonary Critical Care Medicine Sleep Medicine

## 2019-01-27 DIAGNOSIS — N170 Acute kidney failure with tubular necrosis: Secondary | ICD-10-CM | POA: Diagnosis not present

## 2019-01-27 DIAGNOSIS — J9621 Acute and chronic respiratory failure with hypoxia: Secondary | ICD-10-CM | POA: Diagnosis not present

## 2019-01-27 DIAGNOSIS — J80 Acute respiratory distress syndrome: Secondary | ICD-10-CM | POA: Diagnosis not present

## 2019-01-27 DIAGNOSIS — J850 Gangrene and necrosis of lung: Secondary | ICD-10-CM | POA: Diagnosis not present

## 2019-01-27 LAB — AMMONIA: Ammonia: 25 umol/L (ref 9–35)

## 2019-01-27 NOTE — Progress Notes (Addendum)
[facility]   PULMONARY CRITICAL CARE SERVICE  PROGRESS NOTE  Date of Service: 01/27/2019  Benjamin [name]  ZOX:[id number]  DOB: 1955-08-27   DOA: 12/14/2018  Referring Physician: Carron CurieAli Hijazi, MD  HPI: Benjamin [name] is a 63 y.o. male seen for follow up of Acute on Chronic Respiratory Failure.  Patient continues on assist control mode currently requiring a PEEP of 8 and FiO2 of 50%.  Medications: Reviewed on Rounds  Physical Exam:  Vitals: Pulse 109 respirations 18 BP 99/59 O2 sat 95% temp 90.8  Ventilator Settings ventilator mode AC VC rate of 26 tidal volume 450 PEEP of 8 FiO2 50%  . General: Comfortable at this time . Eyes: Grossly normal lids, irises & conjunctiva . ENT: grossly tongue is normal . Neck: no obvious mass . Cardiovascular: S1 S2 normal no gallop . Respiratory: Coarse breath sounds . Abdomen: soft . Skin: no rash seen on limited exam . Musculoskeletal: not rigid . Psychiatric:unable to assess . Neurologic: no seizure no involuntary movements         Lab Data:   Basic Metabolic Panel: Recent Labs  Lab 01/21/19 1112 01/22/19 0650 01/23/19 0848 01/24/19 1057 01/25/19 0749 01/26/19 0657  NA 133* 131* 132* 135  --  135  K 4.0 3.6 3.4* 3.5  --  3.3*  CL 95* 90* 89* 92*  --  94*  CO2 30 32 32 32  --  31  GLUCOSE 115* 132* 89 79  --  148*  BUN 27* 37* 28* 27*  --  27*  CREATININE 0.41* 0.54* 0.48* 0.53*  --  0.41*  CALCIUM 7.9* 8.1* 8.0* 8.0*  --  7.7*  MG  --   --   --   --  1.5* 1.7  PHOS  --   --   --   --  3.3  --     ABG: Recent Labs  Lab 01/21/19 1045  PHART 7.459*  PCO2ART 47.8  PO2ART 60.2*  HCO3 33.4*  O2SAT 90.9    Liver Function Tests: Recent Labs  Lab 01/26/19 0657  AST 27  ALT 33  ALKPHOS 43  BILITOT 0.7  PROT 4.1*  ALBUMIN 1.9*   No results for input(s): LIPASE, AMYLASE in the last 168 hours. Recent Labs  Lab 01/23/19 0848 01/24/19 1057 01/25/19 0749  01/26/19 0657 01/27/19 0635  AMMONIA <9* 26 46* 33 25    CBC: Recent Labs  Lab 01/26/19 0657  WBC 8.2  HGB 10.1*  HCT 30.5*  MCV 98.1  PLT 166    Cardiac Enzymes: Recent Labs  Lab 01/21/19 1112  TROPONINI <0.03    BNP (last 3 results) No results for input(s): BNP in the last 8760 hours.  ProBNP (last 3 results) No results for input(s): PROBNP in the last 8760 hours.  Radiological Exams: Ct Maxillofacial W Contrast  Result Date: 01/26/2019 CLINICAL DATA:  Right facial abscess. EXAM: CT MAXILLOFACIAL WITH CONTRAST TECHNIQUE: Multidetector CT imaging of the maxillofacial structures was performed with intravenous contrast. Multiplanar CT image reconstructions were also generated. CONTRAST:  75mL OMNIPAQUE IOHEXOL 300 MG/ML  SOLN COMPARISON:  None. FINDINGS: Osseous: No evidence of fracture or other focal lesion. The patient is edentulous. Orbits: Normal Sinuses: Complete opacification of the right frontal, ethmoid, sphenoid and maxillary sinuses. Left paranasal sinuses are clear except for a single opacified posterior ethmoid air cell. There is an extensive mastoid effusion on the right. Left mastoid is clear. Soft tissues: There may  be mild soft tissue swelling of the right face which could go along with cellulitis. No evidence of facial abscess or drainable collection. There is an ovoid enhancing structure along the inferior aspect of the superficial lobe of the right parotid measuring 14 mm in transverse dimension with a length 2.3 cm. This most likely represents an enlarged parotid node. The possibility that this could represent a parotid tumor does exist. Limited intracranial: Posterior fossa of fluid collection superior to the cerebellum of uncertain nature. This could represent an arachnoid cyst, but is not completely evaluated. IMPRESSION: Right-sided paranasal sinusitis with complete opacification. Right sided mastoid effusion. Possible right facial cellulitis. No evidence of  abscess or drainable collection. 14 x 14 x 23 mm enhancing structure at the inferior parotid on the right that could represent a reactive parotid lymph or a parotid tumor. Cystic structure superior to the cerebellum could possibly represent an arachnoid cyst. This is not fully or definitively evaluated. Electronically Signed   By: Nelson Chimes M.D.   On: 01/26/2019 14:55    Assessment/Plan Active Problems:   Acute on chronic respiratory failure with hypoxia (HCC)   Acute renal failure due to tubular necrosis (HCC)   Strangulated inguinal hernia   Necrotizing pneumonia (HCC)   Paroxysmal atrial fibrillation with rapid ventricular response (HCC)   ARDS (adult respiratory distress syndrome) (HCC)   Severe sepsis ([place])   1. Acute on chronic respiratory failure hypoxiapatient remains on full support on the ventilator at this time however FiO2 has been weaned down to 50% will progress with weaning per protocol.  Continue supportive measures and pulmonary toilet.  2. Acute renal failure follow labs hydrate as necessary. 3. Strangulated inguinal hernia status post repair 4. Necrotizing pneumonia we will continue to monitor 5. ARDS oxygen requirements have worsened significantly when going to try heated high flow if this does not help then will need to be placed back on the ventilator 6. Paroxysmal atrial fibrillation right now rate controlled continue supportive care 7. Severe sepsis hemodynamically stable continue to monitor   I have personally seen and evaluated the patient, evaluated laboratory and imaging results, formulated the assessment and plan and placed orders. The Patient requires high complexity decision making for assessment and support.  Case was discussed on Rounds with the Respiratory Therapy Staff  Allyne Gee, MD [facility] Pulmonary Critical Care Medicine Sleep Medicine

## 2019-01-28 DIAGNOSIS — J850 Gangrene and necrosis of lung: Secondary | ICD-10-CM | POA: Diagnosis not present

## 2019-01-28 DIAGNOSIS — N170 Acute kidney failure with tubular necrosis: Secondary | ICD-10-CM | POA: Diagnosis not present

## 2019-01-28 DIAGNOSIS — J9621 Acute and chronic respiratory failure with hypoxia: Secondary | ICD-10-CM | POA: Diagnosis not present

## 2019-01-28 DIAGNOSIS — J80 Acute respiratory distress syndrome: Secondary | ICD-10-CM | POA: Diagnosis not present

## 2019-01-28 LAB — BASIC METABOLIC PANEL
Anion gap: 11 (ref 5–15)
BUN: 31 mg/dL — ABNORMAL HIGH (ref 8–23)
CO2: 31 mmol/L (ref 22–32)
Calcium: 7.8 mg/dL — ABNORMAL LOW (ref 8.9–10.3)
Chloride: 93 mmol/L — ABNORMAL LOW (ref 98–111)
Creatinine, Ser: 0.46 mg/dL — ABNORMAL LOW (ref 0.61–1.24)
GFR calc Af Amer: >60 mL/min (ref >60)
GFR calc non Af Amer: >60 mL/min (ref >60)
Glucose, Bld: 145 mg/dL — ABNORMAL HIGH (ref 70–99)
Potassium: 3.4 mmol/L — ABNORMAL LOW (ref 3.5–5.1)
Sodium: 135 mmol/L (ref 135–145)

## 2019-01-28 LAB — MAGNESIUM: Magnesium: 1.7 mg/dL (ref 1.7–2.4)

## 2019-01-28 LAB — PHOSPHORUS: Phosphorus: 2.5 mg/dL (ref 2.5–4.6)

## 2019-01-28 NOTE — Progress Notes (Addendum)
[facility]   PULMONARY CRITICAL CARE SERVICE  PROGRESS NOTE  Date of Service: 01/28/2019  Benjamin [name]  ZOX:[id number]  DOB: 02-29-1956   DOA: 12/14/2018  Referring Physician: Carron CurieAli Hijazi, MD  HPI: Benjamin LedgerRick E Brannan is a 63 y.o. male seen for follow up of Acute on Chronic Respiratory Failure.  Patient remains on full support on the ventilator currently rate 26 with an FiO2 of 60%.  Satting well currently no distress noted.  Medications: Reviewed on Rounds  Physical Exam:  Vitals: Pulse 97 respirations 26 BP 104/56 O2 sat 96% temp 100.2  Ventilator Settings ventilator mode AC VC rate of 26 tidal volume 450 PEEP of 8 FiO2 60%  . General: Comfortable at this time . Eyes: Grossly normal lids, irises & conjunctiva . ENT: grossly tongue is normal . Neck: no obvious mass . Cardiovascular: S1 S2 normal no gallop . Respiratory: Coarse breath sounds . Abdomen: soft . Skin: no rash seen on limited exam . Musculoskeletal: not rigid . Psychiatric:unable to assess . Neurologic: no seizure no involuntary movements         Lab Data:   Basic Metabolic Panel: Recent Labs  Lab 01/22/19 0650 01/23/19 0848 01/24/19 1057 01/25/19 0749 01/26/19 0657 01/28/19 0456  NA 131* 132* 135  --  135 135  K 3.6 3.4* 3.5  --  3.3* 3.4*  CL 90* 89* 92*  --  94* 93*  CO2 32 32 32  --  31 31  GLUCOSE 132* 89 79  --  148* 145*  BUN 37* 28* 27*  --  27* 31*  CREATININE 0.54* 0.48* 0.53*  --  0.41* 0.46*  CALCIUM 8.1* 8.0* 8.0*  --  7.7* 7.8*  MG  --   --   --  1.5* 1.7 1.7  PHOS  --   --   --  3.3  --  2.5    ABG: No results for input(s): PHART, PCO2ART, PO2ART, HCO3, O2SAT in the last 168 hours.  Liver Function Tests: Recent Labs  Lab 01/26/19 0657  AST 27  ALT 33  ALKPHOS 43  BILITOT 0.7  PROT 4.1*  ALBUMIN 1.9*   No results for input(s): LIPASE, AMYLASE in the last 168 hours. Recent Labs  Lab 01/23/19 0848 01/24/19 1057  01/25/19 0749 01/26/19 0657 01/27/19 0635  AMMONIA <9* 26 46* 33 25    CBC: Recent Labs  Lab 01/26/19 0657  WBC 8.2  HGB 10.1*  HCT 30.5*  MCV 98.1  PLT 166    Cardiac Enzymes: No results for input(s): CKTOTAL, CKMB, CKMBINDEX, TROPONINI in the last 168 hours.  BNP (last 3 results) No results for input(s): BNP in the last 8760 hours.  ProBNP (last 3 results) No results for input(s): PROBNP in the last 8760 hours.  Radiological Exams: Ct Maxillofacial W Contrast  Result Date: 01/26/2019 CLINICAL DATA:  Right facial abscess. EXAM: CT MAXILLOFACIAL WITH CONTRAST TECHNIQUE: Multidetector CT imaging of the maxillofacial structures was performed with intravenous contrast. Multiplanar CT image reconstructions were also generated. CONTRAST:  75mL OMNIPAQUE IOHEXOL 300 MG/ML  SOLN COMPARISON:  None. FINDINGS: Osseous: No evidence of fracture or other focal lesion. The patient is edentulous. Orbits: Normal Sinuses: Complete opacification of the right frontal, ethmoid, sphenoid and maxillary sinuses. Left paranasal sinuses are clear except for a single opacified posterior ethmoid air cell. There is an extensive mastoid effusion on the right. Left mastoid is clear. Soft tissues: There may be mild soft tissue  swelling of the right face which could go along with cellulitis. No evidence of facial abscess or drainable collection. There is an ovoid enhancing structure along the inferior aspect of the superficial lobe of the right parotid measuring 14 mm in transverse dimension with a length 2.3 cm. This most likely represents an enlarged parotid node. The possibility that this could represent a parotid tumor does exist. Limited intracranial: Posterior fossa of fluid collection superior to the cerebellum of uncertain nature. This could represent an arachnoid cyst, but is not completely evaluated. IMPRESSION: Right-sided paranasal sinusitis with complete opacification. Right sided mastoid effusion.  Possible right facial cellulitis. No evidence of abscess or drainable collection. 14 x 14 x 23 mm enhancing structure at the inferior parotid on the right that could represent a reactive parotid lymph or a parotid tumor. Cystic structure superior to the cerebellum could possibly represent an arachnoid cyst. This is not fully or definitively evaluated. Electronically Signed   By: Nelson Chimes M.D.   On: 01/26/2019 14:55    Assessment/Plan Active Problems:   Acute on chronic respiratory failure with hypoxia (HCC)   Acute renal failure due to tubular necrosis (HCC)   Strangulated inguinal hernia   Necrotizing pneumonia (HCC)   Paroxysmal atrial fibrillation with rapid ventricular response (HCC)   ARDS (adult respiratory distress syndrome) (HCC)   Severe sepsis ([place])   1. Acute on chronic respiratory failure hypoxiapatient remains on full support on the ventilator at this time his FiO2 has increased to 60%.  We will continue to monitor.  Continue supportive measures and pulmonary toilet at this time. 2. Acute renal failure followlabs hydrate as necessary. 3. Strangulated inguinal hernia status post repair 4. Necrotizing pneumonia we will continue to monitor 5. ARDS oxygen requirements have worsened significantly when going to try heated high flow if this does not help then will need to be placed back on the ventilator 6. Paroxysmal atrial fibrillation right now rate controlled continue supportive care 7. Severe sepsis hemodynamically stable continue to monitor   I have personally seen and evaluated the patient, evaluated laboratory and imaging results, formulated the assessment and plan and placed orders. The Patient requires high complexity decision making for assessment and support.  Case was discussed on Rounds with the Respiratory Therapy Staff  Allyne Gee, MD [facility] Pulmonary Critical Care Medicine Sleep Medicine

## 2019-01-29 DIAGNOSIS — J850 Gangrene and necrosis of lung: Secondary | ICD-10-CM | POA: Diagnosis not present

## 2019-01-29 DIAGNOSIS — J9621 Acute and chronic respiratory failure with hypoxia: Secondary | ICD-10-CM | POA: Diagnosis not present

## 2019-01-29 DIAGNOSIS — N170 Acute kidney failure with tubular necrosis: Secondary | ICD-10-CM | POA: Diagnosis not present

## 2019-01-29 DIAGNOSIS — J80 Acute respiratory distress syndrome: Secondary | ICD-10-CM | POA: Diagnosis not present

## 2019-01-29 NOTE — Progress Notes (Addendum)
[facility]   PULMONARY CRITICAL CARE SERVICE  PROGRESS NOTE  Date of Service: 01/29/2019  Benjamin LedgerRick E Gross  ZOX:[id number]  DOB: 1955-08-22   DOA: 12/14/2018  Referring Physician: Carron CurieAli Hijazi, MD  HPI: Benjamin [name] is a 63 y.o. male seen for follow up of Acute on Chronic Respiratory Failure.  Patient remains on full support ventilator currently requiring 60% FiO2 and well with no distress.  Medications: Reviewed on Rounds  Physical Exam:  Vitals: Pulse 104 respirations 27 BP 111/65 O2 sat 91% temp 99.4  Ventilator Settings ventilator mode AC VC rate 26 tidal volume 450 PEEP of 8 FiO2 60%  . General: Comfortable at this time . Eyes: Grossly normal lids, irises & conjunctiva . ENT: grossly tongue is normal . Neck: no obvious mass . Cardiovascular: S1 S2 normal no gallop . Respiratory: Coarse breath sounds . Abdomen: soft . Skin: no rash seen on limited exam . Musculoskeletal: not rigid . Psychiatric:unable to assess . Neurologic: no seizure no involuntary movements         Lab Data:   Basic Metabolic Panel: Recent Labs  Lab 01/23/19 0848 01/24/19 1057 01/25/19 0749 01/26/19 0657 01/28/19 0456  NA 132* 135  --  135 135  K 3.4* 3.5  --  3.3* 3.4*  CL 89* 92*  --  94* 93*  CO2 32 32  --  31 31  GLUCOSE 89 79  --  148* 145*  BUN 28* 27*  --  27* 31*  CREATININE 0.48* 0.53*  --  0.41* 0.46*  CALCIUM 8.0* 8.0*  --  7.7* 7.8*  MG  --   --  1.5* 1.7 1.7  PHOS  --   --  3.3  --  2.5    ABG: No results for input(s): PHART, PCO2ART, PO2ART, HCO3, O2SAT in the last 168 hours.  Liver Function Tests: Recent Labs  Lab 01/26/19 0657  AST 27  ALT 33  ALKPHOS 43  BILITOT 0.7  PROT 4.1*  ALBUMIN 1.9*   No results for input(s): LIPASE, AMYLASE in the last 168 hours. Recent Labs  Lab 01/23/19 0848 01/24/19 1057 01/25/19 0749 01/26/19 0657 01/27/19 0635  AMMONIA <9* 26 46* 33 25    CBC: Recent Labs  Lab  01/26/19 0657  WBC 8.2  HGB 10.1*  HCT 30.5*  MCV 98.1  PLT 166    Cardiac Enzymes: No results for input(s): CKTOTAL, CKMB, CKMBINDEX, TROPONINI in the last 168 hours.  BNP (last 3 results) No results for input(s): BNP in the last 8760 hours.  ProBNP (last 3 results) No results for input(s): PROBNP in the last 8760 hours.  Radiological Exams: No results found.  Assessment/Plan Active Problems:   Acute on chronic respiratory failure with hypoxia (HCC)   Acute renal failure due to tubular necrosis (HCC)   Strangulated inguinal hernia   Necrotizing pneumonia (HCC)   Paroxysmal atrial fibrillation with rapid ventricular response (HCC)   ARDS (adult respiratory distress syndrome) (HCC)   Severe sepsis (HCC)   1. Acute on chronic respiratory failure with hypoxia patient continues on full support on the vent at this time currently requiring 60% FiO2.  We will continue to monitor.  Continue pulmonary toilet supportive measures. 2. Acute renal failure follow labs hydrate as necessary 3. Strangulated inguinal hernia status post repair 4. Necrotizing pneumonia continue to monitor 5. ARDS oxygen requirement have worsened continue supportive measures and pulmonary toilet. 6. Paroxysmal atrial fibrillation rate controlled at this  time continue supportive measures 7. Severe sepsis hemodynamically stable continue to monitor   I have personally seen and evaluated the patient, evaluated laboratory and imaging results, formulated the assessment and plan and placed orders. The Patient requires high complexity decision making for assessment and support.  Case was discussed on Rounds with the Respiratory Therapy Staff  Allyne Gee, MD [facility] Pulmonary Critical Care Medicine Sleep Medicine

## 2019-01-30 DIAGNOSIS — J850 Gangrene and necrosis of lung: Secondary | ICD-10-CM | POA: Diagnosis not present

## 2019-01-30 DIAGNOSIS — J80 Acute respiratory distress syndrome: Secondary | ICD-10-CM | POA: Diagnosis not present

## 2019-01-30 DIAGNOSIS — J9621 Acute and chronic respiratory failure with hypoxia: Secondary | ICD-10-CM | POA: Diagnosis not present

## 2019-01-30 DIAGNOSIS — N170 Acute kidney failure with tubular necrosis: Secondary | ICD-10-CM | POA: Diagnosis not present

## 2019-01-30 NOTE — Progress Notes (Addendum)
Pulmonary Critical Care Medicine [place]   PULMONARY CRITICAL CARE SERVICE  PROGRESS NOTE  Date of Service: 01/30/2019  LESHAUN BIEBEL  ZYS:[id number]  DOB: Dec 24, 1955   DOA: 12/14/2018  Referring Physician: Merton Border, MD  HPI: BUTLER VEGH is a 63 y.o. male seen for follow up of Acute on Chronic Respiratory Failure.  Patient continues on full support at this time FiO2 has been weaned to 50% currently satting well no distress.  Medications: Reviewed on Rounds  Physical Exam:  Vitals: Pulse 96 respiration 26 BP 86/55 O2 sat 100% temp 98.7  Ventilator Settings ventilator mode AC VC rate of 26 tidal volume 40 PEEP of 8 FiO2 50%  . General: Comfortable at this time . Eyes: Grossly normal lids, irises & conjunctiva . ENT: grossly tongue is normal . Neck: no obvious mass . Cardiovascular: S1 S2 normal no gallop . Respiratory: Coarse breath sounds . Abdomen: soft . Skin: no rash seen on limited exam . Musculoskeletal: not rigid . Psychiatric:unable to assess . Neurologic: no seizure no involuntary movements         Lab Data:   Basic Metabolic Panel: Recent Labs  Lab 01/24/19 1057 01/25/19 0749 01/26/19 0657 01/28/19 0456  NA 135  --  135 135  K 3.5  --  3.3* 3.4*  CL 92*  --  94* 93*  CO2 32  --  31 31  GLUCOSE 79  --  148* 145*  BUN 27*  --  27* 31*  CREATININE 0.53*  --  0.41* 0.46*  CALCIUM 8.0*  --  7.7* 7.8*  MG  --  1.5* 1.7 1.7  PHOS  --  3.3  --  2.5    ABG: No results for input(s): PHART, PCO2ART, PO2ART, HCO3, O2SAT in the last 168 hours.  Liver Function Tests: Recent Labs  Lab 01/26/19 0657  AST 27  ALT 33  ALKPHOS 43  BILITOT 0.7  PROT 4.1*  ALBUMIN 1.9*   No results for input(s): LIPASE, AMYLASE in the last 168 hours. Recent Labs  Lab 01/24/19 1057 01/25/19 0749 01/26/19 0657 01/27/19 0635  AMMONIA 26 46* 33 25    CBC: Recent Labs  Lab 01/26/19 0657  WBC 8.2  HGB 10.1*  HCT 30.5*  MCV 98.1  PLT 166     Cardiac Enzymes: No results for input(s): CKTOTAL, CKMB, CKMBINDEX, TROPONINI in the last 168 hours.  BNP (last 3 results) No results for input(s): BNP in the last 8760 hours.  ProBNP (last 3 results) No results for input(s): PROBNP in the last 8760 hours.  Radiological Exams: No results found.  Assessment/Plan Active Problems:   Acute on chronic respiratory failure with hypoxia (HCC)   Acute renal failure due to tubular necrosis (HCC)   Strangulated inguinal hernia   Necrotizing pneumonia (HCC)   Paroxysmal atrial fibrillation with rapid ventricular response (HCC)   ARDS (adult respiratory distress syndrome) (HCC)   Severe sepsis ([place])   1. Acute on chronic respiratory failure with hypoxia patient continues on full support on the vent at this time currently requiring 50% FiO2.  We will continue to monitor.  Continue pulmonary toilet supportive measures. 2. Acute renal failure follow labs hydrate as necessary 3. Strangulated inguinal hernia status post repair 4. Necrotizing pneumonia continue to monitor 5. ARDS oxygen requirement have worsened continue supportive measures and pulmonary toilet. 6. Paroxysmal atrial fibrillation rate controlled at this time continue supportive measures 7. Severe sepsis hemodynamically stable continue to monitor  I have personally seen and evaluated the patient, evaluated laboratory and imaging results, formulated the assessment and plan and placed orders. The Patient requires high complexity decision making for assessment and support.  Case was discussed on Rounds with the Respiratory Therapy Staff  Allyne Gee, MD [facility] Pulmonary Critical Care Medicine Sleep Medicine

## 2019-01-31 ENCOUNTER — Other Ambulatory Visit (HOSPITAL_COMMUNITY): Payer: Self-pay

## 2019-01-31 DIAGNOSIS — J850 Gangrene and necrosis of lung: Secondary | ICD-10-CM | POA: Diagnosis not present

## 2019-01-31 DIAGNOSIS — N170 Acute kidney failure with tubular necrosis: Secondary | ICD-10-CM | POA: Diagnosis not present

## 2019-01-31 DIAGNOSIS — J90 Pleural effusion, not elsewhere classified: Secondary | ICD-10-CM

## 2019-01-31 DIAGNOSIS — J80 Acute respiratory distress syndrome: Secondary | ICD-10-CM | POA: Diagnosis not present

## 2019-01-31 DIAGNOSIS — J9621 Acute and chronic respiratory failure with hypoxia: Secondary | ICD-10-CM | POA: Diagnosis not present

## 2019-01-31 LAB — PHOSPHORUS: Phosphorus: 3.9 mg/dL (ref 2.5–4.6)

## 2019-01-31 LAB — MAGNESIUM: Magnesium: 1.8 mg/dL (ref 1.7–2.4)

## 2019-01-31 NOTE — Progress Notes (Signed)
Pulmonary Critical Care Medicine [place]   PULMONARY CRITICAL CARE SERVICE  PROGRESS NOTE  Date of Service: 01/31/2019  Benjamin [name]  VOZ:[id number]  DOB: 06-Mar-1956   DOA: 12/14/2018  Referring Physician: Merton Border, MD  HPI: Benjamin [name] is a 63 y.o. male seen for follow up of Acute on Chronic Respiratory Failure.  Patient currently is on T collar however has been on heated high flow spoke with respiratory therapy during rounds and we will go ahead and place back on the ventilator  Medications: Reviewed on Rounds  Physical Exam:  Vitals: Temperature 96.8 pulse 98 respiratory rate 30 blood pressure 146/60 saturations 94%  Ventilator Settings on T collar currently but will be put back on the vent  . General: Comfortable at this time . Eyes: Grossly normal lids, irises & conjunctiva . ENT: grossly tongue is normal . Neck: no obvious mass . Cardiovascular: S1 S2 normal no gallop . Respiratory: No rhonchi coarse breath sounds . Abdomen: soft . Skin: no rash seen on limited exam . Musculoskeletal: not rigid . Psychiatric:unable to assess . Neurologic: no seizure no involuntary movements         Lab Data:   Basic Metabolic Panel: Recent Labs  Lab 01/25/19 0749 01/26/19 0657 01/28/19 0456 01/31/19 0728  NA  --  135 135  --   K  --  3.3* 3.4*  --   CL  --  94* 93*  --   CO2  --  31 31  --   GLUCOSE  --  148* 145*  --   BUN  --  27* 31*  --   CREATININE  --  0.41* 0.46*  --   CALCIUM  --  7.7* 7.8*  --   MG 1.5* 1.7 1.7 1.8  PHOS 3.3  --  2.5 3.9    ABG: No results for input(s): PHART, PCO2ART, PO2ART, HCO3, O2SAT in the last 168 hours.  Liver Function Tests: Recent Labs  Lab 01/26/19 0657  AST 27  ALT 33  ALKPHOS 43  BILITOT 0.7  PROT 4.1*  ALBUMIN 1.9*   No results for input(s): LIPASE, AMYLASE in the last 168 hours. Recent Labs  Lab 01/25/19 0749 01/26/19 0657 01/27/19 0635  AMMONIA 46* 33 25    CBC: Recent Labs   Lab 01/26/19 0657  WBC 8.2  HGB 10.1*  HCT 30.5*  MCV 98.1  PLT 166    Cardiac Enzymes: No results for input(s): CKTOTAL, CKMB, CKMBINDEX, TROPONINI in the last 168 hours.  BNP (last 3 results) No results for input(s): BNP in the last 8760 hours.  ProBNP (last 3 results) No results for input(s): PROBNP in the last 8760 hours.  Radiological Exams: Dg Chest Port 1 View  Result Date: 01/31/2019 CLINICAL DATA:  Pleural effusion EXAM: PORTABLE CHEST 1 VIEW COMPARISON:  Chest x-ray 01/24/2019, chest CT 01/24/2019 FINDINGS: The lungs are hyperinflated likely secondary to COPD. There is a small left pleural effusion. There is no pneumothorax. There is no focal consolidation. The heart mediastinum stable. No acute osseous abnormality. IMPRESSION: 1. Persistent small left pleural effusion. 2. COPD. Electronically Signed   By: Kathreen Devoid   On: 01/31/2019 13:03    Assessment/Plan Active Problems:   Acute on chronic respiratory failure with hypoxia (HCC)   Acute renal failure due to tubular necrosis (HCC)   Strangulated inguinal hernia   Necrotizing pneumonia (HCC)   Paroxysmal atrial fibrillation with rapid ventricular response (HCC)   ARDS (adult respiratory  distress syndrome) (HCC)   Severe sepsis (HCC)   1. Acute on chronic respiratory failure with hypoxia we will going to continue weaning however patient is not tolerating well placed back on the ventilator now 2. Acute renal failure follow-up labs 3. Strangulated hernia status post surgery 4. Necrotizing pneumonia grossly unchanged with persistence of a small effusion 5. Paroxysmal atrial fibrillation rate controlled 6. ARDS clinically improving however oxygen requirements have been gone back up the latest chest film that was done showed hyperinflation with COPD no focal consolidation was noted 7. Severe sepsis treated hemodynamics are stable   I have personally seen and evaluated the patient, evaluated laboratory and imaging  results, formulated the assessment and plan and placed orders.  Time 35 minutes The Patient requires high complexity decision making for assessment and support.  Case was discussed on Rounds with the Respiratory Therapy Staff  Yevonne PaxSaadat A Nehal Witting, MD [facility] Pulmonary Critical Care Medicine Sleep Medicine

## 2019-02-01 DIAGNOSIS — J80 Acute respiratory distress syndrome: Secondary | ICD-10-CM | POA: Diagnosis not present

## 2019-02-01 DIAGNOSIS — J850 Gangrene and necrosis of lung: Secondary | ICD-10-CM | POA: Diagnosis not present

## 2019-02-01 DIAGNOSIS — J9621 Acute and chronic respiratory failure with hypoxia: Secondary | ICD-10-CM | POA: Diagnosis not present

## 2019-02-01 DIAGNOSIS — N170 Acute kidney failure with tubular necrosis: Secondary | ICD-10-CM | POA: Diagnosis not present

## 2019-02-01 LAB — COMPREHENSIVE METABOLIC PANEL
ALT: 126 U/L — ABNORMAL HIGH (ref 0–44)
AST: 46 U/L — ABNORMAL HIGH (ref 15–41)
Albumin: 2 g/dL — ABNORMAL LOW (ref 3.5–5.0)
Alkaline Phosphatase: 60 U/L (ref 38–126)
Anion gap: 12 (ref 5–15)
BUN: 72 mg/dL — ABNORMAL HIGH (ref 8–23)
CO2: 29 mmol/L (ref 22–32)
Calcium: 8.1 mg/dL — ABNORMAL LOW (ref 8.9–10.3)
Chloride: 103 mmol/L (ref 98–111)
Creatinine, Ser: 0.64 mg/dL (ref 0.61–1.24)
GFR calc Af Amer: >60 mL/min (ref >60)
GFR calc non Af Amer: >60 mL/min (ref >60)
Glucose, Bld: 170 mg/dL — ABNORMAL HIGH (ref 70–99)
Potassium: 3.6 mmol/L (ref 3.5–5.1)
Sodium: 144 mmol/L (ref 135–145)
Total Bilirubin: 0.5 mg/dL (ref 0.3–1.2)
Total Protein: 4.3 g/dL — ABNORMAL LOW (ref 6.5–8.1)

## 2019-02-01 LAB — CBC
HCT: 17.4 % — ABNORMAL LOW (ref 39.0–52.0)
Hemoglobin: 5.4 g/dL — CL (ref 13.0–17.0)
MCH: 32.7 pg (ref 26.0–34.0)
MCHC: 31 g/dL (ref 30.0–36.0)
MCV: 105.5 fL — ABNORMAL HIGH (ref 80.0–100.0)
Platelets: 153 10*3/uL (ref 150–400)
RBC: 1.65 MIL/uL — ABNORMAL LOW (ref 4.22–5.81)
RDW: 15.7 % — ABNORMAL HIGH (ref 11.5–15.5)
WBC: 17 10*3/uL — ABNORMAL HIGH (ref 4.0–10.5)
nRBC: 0.1 % (ref 0.0–0.2)

## 2019-02-01 LAB — HEMOGLOBIN AND HEMATOCRIT, BLOOD
HCT: 23.3 % — ABNORMAL LOW (ref 39.0–52.0)
Hemoglobin: 7.1 g/dL — ABNORMAL LOW (ref 13.0–17.0)

## 2019-02-01 LAB — MAGNESIUM: Magnesium: 1.9 mg/dL (ref 1.7–2.4)

## 2019-02-01 LAB — TRIGLYCERIDES: Triglycerides: 237 mg/dL — ABNORMAL HIGH (ref <150)

## 2019-02-01 LAB — PREPARE RBC (CROSSMATCH)

## 2019-02-01 LAB — OCCULT BLOOD X 1 CARD TO LAB, STOOL: Fecal Occult Bld: POSITIVE — AB

## 2019-02-01 NOTE — Progress Notes (Signed)
[facility]   PULMONARY CRITICAL CARE SERVICE  PROGRESS NOTE  Date of Service: 02/01/2019  Jillyn LedgerRick E Salvetti  XLK:[id number]  DOB: 1956/05/18   DOA: 12/14/2018  Referring Physician: Carron CurieAli Hijazi, MD  HPI: Jillyn LedgerRick E Spiker is a 63 y.o. male seen for follow up of Acute on Chronic Respiratory Failure.  Patient is on full support at this time the oxygen requirements are going up to 90%.  Currently patient was on a PEEP of 10  Medications: Reviewed on Rounds  Physical Exam:  Vitals: Temperature 96.7 pulse 91 respiratory rate 20 blood pressure 135/54 saturations 100%  Ventilator Settings mode ventilation assist control FiO2 90% tidal volume 450 PEEP 10  . General: Comfortable at this time . Eyes: Grossly normal lids, irises & conjunctiva . ENT: grossly tongue is normal . Neck: no obvious mass . Cardiovascular: S1 S2 normal no gallop . Respiratory: Coarse breath sounds with few rhonchi . Abdomen: soft . Skin: no rash seen on limited exam . Musculoskeletal: not rigid . Psychiatric:unable to assess . Neurologic: no seizure no involuntary movements         Lab Data:   Basic Metabolic Panel: Recent Labs  Lab 01/26/19 0657 01/28/19 0456 01/31/19 0728 02/01/19 0731  NA 135 135  --  144  K 3.3* 3.4*  --  3.6  CL 94* 93*  --  103  CO2 31 31  --  29  GLUCOSE 148* 145*  --  170*  BUN 27* 31*  --  72*  CREATININE 0.41* 0.46*  --  0.64  CALCIUM 7.7* 7.8*  --  8.1*  MG 1.7 1.7 1.8 1.9  PHOS  --  2.5 3.9  --     ABG: No results for input(s): PHART, PCO2ART, PO2ART, HCO3, O2SAT in the last 168 hours.  Liver Function Tests: Recent Labs  Lab 01/26/19 0657 02/01/19 0731  AST 27 46*  ALT 33 126*  ALKPHOS 43 60  BILITOT 0.7 0.5  PROT 4.1* 4.3*  ALBUMIN 1.9* 2.0*   No results for input(s): LIPASE, AMYLASE in the last 168 hours. Recent Labs  Lab 01/26/19 0657 01/27/19 0635  AMMONIA 33 25    CBC: Recent Labs  Lab  01/26/19 0657 02/01/19 0731  WBC 8.2 17.0*  HGB 10.1* 5.4*  HCT 30.5* 17.4*  MCV 98.1 105.5*  PLT 166 153    Cardiac Enzymes: No results for input(s): CKTOTAL, CKMB, CKMBINDEX, TROPONINI in the last 168 hours.  BNP (last 3 results) No results for input(s): BNP in the last 8760 hours.  ProBNP (last 3 results) No results for input(s): PROBNP in the last 8760 hours.  Radiological Exams: Dg Chest Port 1 View  Result Date: 01/31/2019 CLINICAL DATA:  Pleural effusion EXAM: PORTABLE CHEST 1 VIEW COMPARISON:  Chest x-ray 01/24/2019, chest CT 01/24/2019 FINDINGS: The lungs are hyperinflated likely secondary to COPD. There is a small left pleural effusion. There is no pneumothorax. There is no focal consolidation. The heart mediastinum stable. No acute osseous abnormality. IMPRESSION: 1. Persistent small left pleural effusion. 2. COPD. Electronically Signed   By: Elige KoHetal  Patel   On: 01/31/2019 13:03    Assessment/Plan Active Problems:   Acute on chronic respiratory failure with hypoxia (HCC)   Acute renal failure due to tubular necrosis (HCC)   Strangulated inguinal hernia   Necrotizing pneumonia (HCC)   Paroxysmal atrial fibrillation with rapid ventricular response (HCC)   ARDS (adult respiratory distress syndrome) (HCC)   Severe sepsis (  [place])   1. Acute on chronic respiratory failure with hypoxia we will increase the PEEP to 14 now also recommended getting an echo study with bubble study to assess RV pressures as well as to make certain there is no shunting due to the severe hypoxia and chest x-ray really not too remarkable other than for COPD 2. Acute renal failure we will continue with supportive care monitoring labs 3. Strangulated inguinal hernia status post repair 4. Necrotizing pneumonia treated 5. Paroxysmal atrial fibrillation rate controlled 6. ARDS radiologically has been resolved 7. Severe sepsis resolved hemodynamically stable at this time   I have personally seen and  evaluated the patient, evaluated laboratory and imaging results, formulated the assessment and plan and placed orders. The Patient requires high complexity decision making for assessment and support.  Case was discussed on Rounds with the Respiratory Therapy Staff  Allyne Gee, MD [facility] Pulmonary Critical Care Medicine Sleep Medicine

## 2019-02-02 ENCOUNTER — Other Ambulatory Visit: Payer: Self-pay

## 2019-02-02 DIAGNOSIS — N170 Acute kidney failure with tubular necrosis: Secondary | ICD-10-CM | POA: Diagnosis not present

## 2019-02-02 DIAGNOSIS — J850 Gangrene and necrosis of lung: Secondary | ICD-10-CM | POA: Diagnosis not present

## 2019-02-02 DIAGNOSIS — J9621 Acute and chronic respiratory failure with hypoxia: Secondary | ICD-10-CM | POA: Diagnosis not present

## 2019-02-02 DIAGNOSIS — J80 Acute respiratory distress syndrome: Secondary | ICD-10-CM | POA: Diagnosis not present

## 2019-02-02 LAB — CBC
HCT: 16.5 % — ABNORMAL LOW (ref 39.0–52.0)
HCT: 20.9 % — ABNORMAL LOW (ref 39.0–52.0)
Hemoglobin: 5 g/dL — CL (ref 13.0–17.0)
Hemoglobin: 6.4 g/dL — CL (ref 13.0–17.0)
MCH: 32.9 pg (ref 26.0–34.0)
MCH: 30.5 pg (ref 26.0–34.0)
MCHC: 30.3 g/dL (ref 30.0–36.0)
MCHC: 30.6 g/dL (ref 30.0–36.0)
MCV: 108.6 fL — ABNORMAL HIGH (ref 80.0–100.0)
MCV: 99.5 fL (ref 80.0–100.0)
Platelets: 246 10*3/uL (ref 150–400)
Platelets: 188 10*3/uL (ref 150–400)
RBC: 1.52 MIL/uL — ABNORMAL LOW (ref 4.22–5.81)
RBC: 2.1 MIL/uL — ABNORMAL LOW (ref 4.22–5.81)
RDW: 16.7 % — ABNORMAL HIGH (ref 11.5–15.5)
RDW: 24 % — ABNORMAL HIGH (ref 11.5–15.5)
WBC: 20.8 10*3/uL — ABNORMAL HIGH (ref 4.0–10.5)
WBC: 24.4 10*3/uL — ABNORMAL HIGH (ref 4.0–10.5)
nRBC: 0.6 % — ABNORMAL HIGH (ref 0.0–0.2)
nRBC: 1.5 % — ABNORMAL HIGH (ref 0.0–0.2)

## 2019-02-02 LAB — AEROBIC CULTURE W GRAM STAIN (SUPERFICIAL SPECIMEN)

## 2019-02-02 LAB — HEMOGLOBIN AND HEMATOCRIT, BLOOD
HCT: 20.5 % — ABNORMAL LOW (ref 39.0–52.0)
Hemoglobin: 6.4 g/dL — CL (ref 13.0–17.0)

## 2019-02-02 LAB — PREPARE RBC (CROSSMATCH)

## 2019-02-02 NOTE — Progress Notes (Addendum)
Pulmonary Critical Care Medicine [place]   PULMONARY CRITICAL CARE SERVICE  PROGRESS NOTE  Date of Service: 02/02/2019  Benjamin [name]  YHC:[id number]  DOB: Mar 08, 1956   DOA: 12/14/2018  Referring Physician: Merton Border, MD  HPI: Benjamin [name] is a 63 y.o. male seen for follow up of Acute on Chronic Respiratory Failure.  Patient remains on control with a rate of 26 and FiO2 of 80% currently.  Patient is receiving blood due to lower hemoglobin today.  Medications: Reviewed on Rounds  Physical Exam:  Vitals: Pulse 93 respirations 26 BP 103/61 O2 sat 97% temp 97.7  Ventilator Settings ventilator mode AC VC rate of 26 tidal volume 450 PEEP of 12 FiO2 of 80%  . General: Comfortable at this time . Eyes: Grossly normal lids, irises & conjunctiva . ENT: grossly tongue is normal . Neck: no obvious mass . Cardiovascular: S1 S2 normal no gallop . Respiratory: Coarse breath sounds . Abdomen: soft . Skin: no rash seen on limited exam . Musculoskeletal: not rigid . Psychiatric:unable to assess . Neurologic: no seizure no involuntary movements         Lab Data:   Basic Metabolic Panel: Recent Labs  Lab 01/28/19 0456 01/31/19 0728 02/01/19 0731  NA 135  --  144  K 3.4*  --  3.6  CL 93*  --  103  CO2 31  --  29  GLUCOSE 145*  --  170*  BUN 31*  --  72*  CREATININE 0.46*  --  0.64  CALCIUM 7.8*  --  8.1*  MG 1.7 1.8 1.9  PHOS 2.5 3.9  --     ABG: No results for input(s): PHART, PCO2ART, PO2ART, HCO3, O2SAT in the last 168 hours.  Liver Function Tests: Recent Labs  Lab 02/01/19 0731  AST 46*  ALT 126*  ALKPHOS 60  BILITOT 0.5  PROT 4.3*  ALBUMIN 2.0*   No results for input(s): LIPASE, AMYLASE in the last 168 hours. Recent Labs  Lab 01/27/19 0635  AMMONIA 25    CBC: Recent Labs  Lab 02/01/19 0731 02/01/19 1004 02/02/19 0937  WBC 17.0*  --  20.8*  HGB 5.4* 7.1* 5.0*  HCT 17.4* 23.3* 16.5*  MCV 105.5*  --  108.6*  PLT 153  --  246     Cardiac Enzymes: No results for input(s): CKTOTAL, CKMB, CKMBINDEX, TROPONINI in the last 168 hours.  BNP (last 3 results) No results for input(s): BNP in the last 8760 hours.  ProBNP (last 3 results) No results for input(s): PROBNP in the last 8760 hours.  Radiological Exams: No results found.  Assessment/Plan Active Problems:   Acute on chronic respiratory failure with hypoxia (HCC)   Acute renal failure due to tubular necrosis (HCC)   Strangulated inguinal hernia   Necrotizing pneumonia (HCC)   Paroxysmal atrial fibrillation with rapid ventricular response (HCC)   ARDS (adult respiratory distress syndrome) (HCC)   Severe sepsis ([place])   1. Acute on chronic respiratory failure with hypoxia patient continues to require 80% FiO2 at this time a PEEP of 12.  Continue supportive measures and pulmonary toilet at this time.  Continue ventilator support. 2. Acute renal failure we will continue with supportive care monitoring labs 3. Strangulated inguinal hernia status post repair 4. Necrotizing pneumonia treated 5. Paroxysmal atrial fibrillation rate controlled 6. ARDS radiologically has been resolved 7. Severe sepsis resolved hemodynamically stable at this time   I have personally seen and evaluated the patient, evaluated  laboratory and imaging results, formulated the assessment and plan and placed orders. The Patient requires high complexity decision making for assessment and support.  Case was discussed on Rounds with the Respiratory Therapy Staff  Allyne Gee, MD [facility] Pulmonary Critical Care Medicine Sleep Medicine

## 2019-02-03 DIAGNOSIS — J9621 Acute and chronic respiratory failure with hypoxia: Secondary | ICD-10-CM | POA: Diagnosis not present

## 2019-02-03 DIAGNOSIS — J80 Acute respiratory distress syndrome: Secondary | ICD-10-CM | POA: Diagnosis not present

## 2019-02-03 DIAGNOSIS — J850 Gangrene and necrosis of lung: Secondary | ICD-10-CM | POA: Diagnosis not present

## 2019-02-03 DIAGNOSIS — N170 Acute kidney failure with tubular necrosis: Secondary | ICD-10-CM | POA: Diagnosis not present

## 2019-02-03 LAB — CBC
HCT: 25.5 % — ABNORMAL LOW (ref 39.0–52.0)
HCT: 28.2 % — ABNORMAL LOW (ref 39.0–52.0)
Hemoglobin: 8.5 g/dL — ABNORMAL LOW (ref 13.0–17.0)
Hemoglobin: 8.8 g/dL — ABNORMAL LOW (ref 13.0–17.0)
MCH: 32.4 pg (ref 26.0–34.0)
MCH: 30.4 pg (ref 26.0–34.0)
MCHC: 33.3 g/dL (ref 30.0–36.0)
MCHC: 31.2 g/dL (ref 30.0–36.0)
MCV: 97.3 fL (ref 80.0–100.0)
MCV: 97.6 fL (ref 80.0–100.0)
Platelets: 184 10*3/uL (ref 150–400)
Platelets: 179 10*3/uL (ref 150–400)
RBC: 2.62 MIL/uL — ABNORMAL LOW (ref 4.22–5.81)
RBC: 2.89 MIL/uL — ABNORMAL LOW (ref 4.22–5.81)
RDW: 23.6 % — ABNORMAL HIGH (ref 11.5–15.5)
RDW: 23.8 % — ABNORMAL HIGH (ref 11.5–15.5)
WBC: 23.9 10*3/uL — ABNORMAL HIGH (ref 4.0–10.5)
WBC: 26.7 10*3/uL — ABNORMAL HIGH (ref 4.0–10.5)
nRBC: 2.6 % — ABNORMAL HIGH (ref 0.0–0.2)
nRBC: 2.6 % — ABNORMAL HIGH (ref 0.0–0.2)

## 2019-02-03 LAB — BPAM RBC
Blood Product Expiration Date: 202006292359
Blood Product Expiration Date: 202007132359
ISSUE DATE / TIME: 202006241101
ISSUE DATE / TIME: 202006242051
Unit Type and Rh: 600
Unit Type and Rh: 6200

## 2019-02-03 LAB — TYPE AND SCREEN
ABO/RH(D): A POS
Antibody Screen: NEGATIVE
Unit division: 0
Unit division: 0

## 2019-02-03 LAB — PHOSPHORUS: Phosphorus: 5.9 mg/dL — ABNORMAL HIGH (ref 2.5–4.6)

## 2019-02-03 LAB — BASIC METABOLIC PANEL
Anion gap: 16 — ABNORMAL HIGH (ref 5–15)
BUN: 103 mg/dL — ABNORMAL HIGH (ref 8–23)
CO2: 23 mmol/L (ref 22–32)
Calcium: 8.3 mg/dL — ABNORMAL LOW (ref 8.9–10.3)
Chloride: 101 mmol/L (ref 98–111)
Creatinine, Ser: 1.33 mg/dL — ABNORMAL HIGH (ref 0.61–1.24)
GFR calc Af Amer: >60 mL/min (ref >60)
GFR calc non Af Amer: 57 mL/min — ABNORMAL LOW (ref >60)
Glucose, Bld: 187 mg/dL — ABNORMAL HIGH (ref 70–99)
Potassium: 5.3 mmol/L — ABNORMAL HIGH (ref 3.5–5.1)
Sodium: 140 mmol/L (ref 135–145)

## 2019-02-03 LAB — POTASSIUM: Potassium: 4.9 mmol/L (ref 3.5–5.1)

## 2019-02-03 LAB — MAGNESIUM: Magnesium: 2.3 mg/dL (ref 1.7–2.4)

## 2019-02-03 NOTE — Progress Notes (Signed)
Pulmonary Critical Care Medicine [name]   PULMONARY CRITICAL CARE SERVICE  PROGRESS NOTE  Date of Service: 02/03/2019  [name]  ZOX:[id number]  DOB: 1956-06-02   DOA: 12/14/2018  Referring Physician: Merton Border, MD  HPI: Benjamin [name] is a 63 y.o. male seen for follow up of Acute on Chronic Respiratory Failure.  Patient is on full support on assist control mode at this time has been on 55% FiO2 back on the ventilator  Medications: Reviewed on Rounds  Physical Exam:  Vitals: Temperature 96.7 pulse 94 respiratory rate 24 blood pressure 177/92 saturations 92%  Ventilator Settings mode ventilation assist control FiO2 55% PEEP 12 tidal volume 450  . General: Comfortable at this time . Eyes: Grossly normal lids, irises & conjunctiva . ENT: grossly tongue is normal . Neck: no obvious mass . Cardiovascular: S1 S2 normal no gallop . Respiratory: No rhonchi no rales are noted at this time . Abdomen: soft . Skin: no rash seen on limited exam . Musculoskeletal: not rigid . Psychiatric:unable to assess . Neurologic: no seizure no involuntary movements         Lab Data:   Basic Metabolic Panel: Recent Labs  Lab 01/28/19 0456 01/31/19 0728 02/01/19 0731 02/03/19 0245  NA 135  --  144 140  K 3.4*  --  3.6 5.3*  CL 93*  --  103 101  CO2 31  --  29 23  GLUCOSE 145*  --  170* 187*  BUN 31*  --  72* 103*  CREATININE 0.46*  --  0.64 1.33*  CALCIUM 7.8*  --  8.1* 8.3*  MG 1.7 1.8 1.9 2.3  PHOS 2.5 3.9  --  5.9*    ABG: No results for input(s): PHART, PCO2ART, PO2ART, HCO3, O2SAT in the last 168 hours.  Liver Function Tests: Recent Labs  Lab 02/01/19 0731  AST 46*  ALT 126*  ALKPHOS 60  BILITOT 0.5  PROT 4.3*  ALBUMIN 2.0*   No results for input(s): LIPASE, AMYLASE in the last 168 hours. No results for input(s): AMMONIA in the last 168 hours.  CBC: Recent Labs  Lab 02/01/19 0731 02/01/19 1004 02/02/19 0937 02/02/19 1637  02/02/19 1708 02/03/19 0245  WBC 17.0*  --  20.8*  --  24.4* 23.9*  HGB 5.4* 7.1* 5.0* 6.4* 6.4* 8.5*  HCT 17.4* 23.3* 16.5* 20.5* 20.9* 25.5*  MCV 105.5*  --  108.6*  --  99.5 97.3  PLT 153  --  246  --  188 184    Cardiac Enzymes: No results for input(s): CKTOTAL, CKMB, CKMBINDEX, TROPONINI in the last 168 hours.  BNP (last 3 results) No results for input(s): BNP in the last 8760 hours.  ProBNP (last 3 results) No results for input(s): PROBNP in the last 8760 hours.  Radiological Exams: No results found.  Assessment/Plan Active Problems:   Acute on chronic respiratory failure with hypoxia (HCC)   Acute renal failure due to tubular necrosis (HCC)   Strangulated inguinal hernia   Necrotizing pneumonia (HCC)   Paroxysmal atrial fibrillation with rapid ventricular response (HCC)   ARDS (adult respiratory distress syndrome) (HCC)   Severe sepsis ([place])   1. Acute on chronic respiratory failure with hypoxia we will continue with full support on the ventilator for now patient is failed attempts at high flow and has failed T collar 2. Acute renal failure continue with supportive care monitor labs 3. ARDS patient has had worsening of oxygenation 4. Necrotizing pneumonia treated  5. Paroxysmal atrial fibrillation right now rate controlled 6. Severe sepsis patient is hemodynamically stable   I have personally seen and evaluated the patient, evaluated laboratory and imaging results, formulated the assessment and plan and placed orders. The Patient requires high complexity decision making for assessment and support.  Case was discussed on Rounds with the Respiratory Therapy Staff  Yevonne PaxSaadat A Kiasia Chou, MD [facility] Pulmonary Critical Care Medicine Sleep Medicine

## 2019-02-04 ENCOUNTER — Emergency Department (HOSPITAL_COMMUNITY): Payer: BC Managed Care – PPO

## 2019-02-04 ENCOUNTER — Other Ambulatory Visit (HOSPITAL_COMMUNITY): Payer: Self-pay

## 2019-02-04 ENCOUNTER — Emergency Department (HOSPITAL_COMMUNITY)
Admission: EM | Admit: 2019-02-04 | Discharge: 2019-02-04 | Disposition: A | Discharge status: Other Acute Inpatient Hospital | Payer: BC Managed Care – PPO | Attending: Emergency Medicine | Admitting: Emergency Medicine

## 2019-02-04 ENCOUNTER — Other Ambulatory Visit: Payer: Self-pay

## 2019-02-04 ENCOUNTER — Inpatient Hospital Stay (HOSPITAL_COMMUNITY)
Admit: 2019-02-04 | Discharge: 2019-03-12 | Disposition: E | Payer: BLUE CROSS/BLUE SHIELD | Attending: Internal Medicine | Admitting: Internal Medicine

## 2019-02-04 DIAGNOSIS — R0789 Other chest pain: Secondary | ICD-10-CM | POA: Diagnosis present

## 2019-02-04 DIAGNOSIS — J9621 Acute and chronic respiratory failure with hypoxia: Secondary | ICD-10-CM | POA: Diagnosis not present

## 2019-02-04 DIAGNOSIS — N170 Acute kidney failure with tubular necrosis: Secondary | ICD-10-CM | POA: Diagnosis present

## 2019-02-04 DIAGNOSIS — Z431 Encounter for attention to gastrostomy: Secondary | ICD-10-CM

## 2019-02-04 DIAGNOSIS — Z9689 Presence of other specified functional implants: Secondary | ICD-10-CM

## 2019-02-04 DIAGNOSIS — A419 Sepsis, unspecified organism: Secondary | ICD-10-CM | POA: Diagnosis present

## 2019-02-04 DIAGNOSIS — J9311 Primary spontaneous pneumothorax: Secondary | ICD-10-CM | POA: Diagnosis not present

## 2019-02-04 DIAGNOSIS — J939 Pneumothorax, unspecified: Secondary | ICD-10-CM

## 2019-02-04 DIAGNOSIS — R6520 Severe sepsis without septic shock: Secondary | ICD-10-CM | POA: Diagnosis present

## 2019-02-04 DIAGNOSIS — J80 Acute respiratory distress syndrome: Secondary | ICD-10-CM | POA: Diagnosis present

## 2019-02-04 DIAGNOSIS — J850 Gangrene and necrosis of lung: Secondary | ICD-10-CM | POA: Diagnosis not present

## 2019-02-04 DIAGNOSIS — Z930 Tracheostomy status: Secondary | ICD-10-CM | POA: Insufficient documentation

## 2019-02-04 DIAGNOSIS — I480 Paroxysmal atrial fibrillation: Secondary | ICD-10-CM | POA: Diagnosis present

## 2019-02-04 DIAGNOSIS — K4030 Unilateral inguinal hernia, with obstruction, without gangrene, not specified as recurrent: Secondary | ICD-10-CM | POA: Diagnosis present

## 2019-02-04 MED ORDER — IOHEXOL 300 MG/ML  SOLN
100.0000 mL | Freq: Once | INTRAMUSCULAR | Status: AC | PRN
  Administered 2019-02-04: 100 mL via INTRAVENOUS

## 2019-02-04 NOTE — Progress Notes (Signed)
RT NOTE:  Pt brought down from [facility] and place on ED vent.  Select settings VT: 450, RR: 26, Peep: 14, FiO2: 60% #6 Shiley trach brought for backup and left @ bedside

## 2019-02-04 NOTE — ED Provider Notes (Signed)
[facility] Provider Note   CSN: [id number] Arrival date & time: 25-Oct-2018  2030    History   Chief Complaint No chief complaint on file.   HPI Benjamin [name] is a 63 y.o. male.     The history is provided by the patient and medical records.  Chest Pain Pain location:  L chest Pain quality: aching and sharp   Pain quality: no tightness   Pain quality comment:  History limited as patient has tracheostomy Pain radiates to:  Does not radiate Pain severity:  Moderate Onset quality:  Gradual Duration:  2 days Timing:  Constant Progression:  Worsening Chronicity:  New Context: breathing, movement, raising an arm, at rest and stress   Relieved by:  Nothing Worsened by:  Deep breathing and coughing Ineffective treatments:  Rest, leaning forward, certain positions and oxygen Associated symptoms: cough, fatigue, shortness of breath and weakness   Associated symptoms: no abdominal pain, no anxiety, no diaphoresis, no lower extremity edema, no nausea, no palpitations and no vomiting   Risk factors: immobilization and male sex   Risk factors: no coronary artery disease, no diabetes mellitus and not obese     Past Medical History:  Diagnosis Date   Acute on chronic respiratory failure with hypoxia (HCC)    Acute renal failure due to tubular necrosis (HCC)    Necrotizing pneumonia (HCC)    Paroxysmal atrial fibrillation with rapid ventricular response (HCC)    Strangulated inguinal hernia     Patient Active Problem List   Diagnosis Date Noted   ARDS (adult respiratory distress syndrome) (HCC) 12/14/2018   Severe sepsis (HCC) 12/14/2018   Acute on chronic respiratory failure with hypoxia (HCC)    Acute renal failure due to tubular necrosis (HCC)    Strangulated inguinal hernia    Necrotizing pneumonia (HCC)    Paroxysmal atrial fibrillation with rapid ventricular response (HCC)           Home Medications    Prior to  Admission medications   Not on File    Family History No family history on file.  Social History Social History   Tobacco Use   Smoking status: Not on file  Substance Use Topics   Alcohol use: Not on file   Drug use: Not on file     Allergies   Patient has no known allergies.   Review of Systems Review of Systems  Constitutional: Positive for fatigue. Negative for diaphoresis.  Respiratory: Positive for cough and shortness of breath.   Cardiovascular: Positive for chest pain. Negative for palpitations.  Gastrointestinal: Negative for abdominal pain, nausea and vomiting.  Neurological: Positive for weakness.  All other systems reviewed and are negative.    Physical Exam Updated Vital Signs BP (!) 84/61    Pulse (!) 112    Temp 98.5 F (36.9 C) (Oral)    Resp 19    SpO2 100%   Physical Exam Vitals signs and nursing note reviewed.  Constitutional:      Appearance: He is well-developed. He is ill-appearing.  HENT:     Head: Normocephalic and atraumatic.     Mouth/Throat:     Mouth: Mucous membranes are moist.  Eyes:     Conjunctiva/sclera: Conjunctivae normal.  Neck:     Musculoskeletal: Neck supple. No neck rigidity or muscular tenderness.  Cardiovascular:     Rate and Rhythm: Tachycardia present.  Pulmonary:     Effort: Respiratory distress present.     Breath  sounds: No stridor.     Comments: Decreased breath sounds left side versus right  Trach in place, patient on vent Chest:     Chest wall: No tenderness.  Abdominal:     Palpations: Abdomen is soft.     Tenderness: There is no abdominal tenderness.  Skin:    General: Skin is warm and dry.     Capillary Refill: Capillary refill takes less than 2 seconds.     Coloration: Skin is not pale.  Neurological:     Mental Status: He is alert. Mental status is at baseline.      ED Treatments / Results  Labs (all labs ordered are listed, but only abnormal results are displayed) Labs Reviewed - No  data to display  EKG None  Radiology Ct Abdomen Pelvis W Contrast  Result Date: 01/26/2019 CLINICAL DATA:  Blood in stool EXAM: CT ABDOMEN AND PELVIS WITH CONTRAST TECHNIQUE: Multidetector CT imaging of the abdomen and pelvis was performed using the standard protocol following bolus administration of intravenous contrast. CONTRAST:  100mL OMNIPAQUE IOHEXOL 300 MG/ML  SOLN COMPARISON:  None. FINDINGS: Lower chest: There is a large left pneumothorax, incompletely imaged. There is apparent total atelectasis of the left lower lobe and a small fluid level dependently. Hepatobiliary: No solid liver abnormality is seen. Small gallstone in the dependent gallbladder. No gallbladder wall thickening, or biliary dilatation. Pancreas: Unremarkable. No pancreatic ductal dilatation or surrounding inflammatory changes. Spleen: Normal in size without significant abnormality. Adrenals/Urinary Tract: Adrenal glands are unremarkable. Kidneys are normal, without renal calculi, solid lesion, or hydronephrosis. Foley catheter in the urinary bladder. Stomach/Bowel: Stomach is within normal limits. Appendix appears normal. No evidence of bowel wall thickening, distention, or inflammatory changes. Vascular/Lymphatic: Aortic atherosclerosis. 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 No enlarged abdominal or pelvic lymph nodes. Reproductive: No mass or other significant abnormality. Other: Small volume ascites.  Midline abdominal wound.  Anasarca. Musculoskeletal: No acute or significant osseous findings. IMPRESSION: 1. There is a large left pneumothorax, incompletely imaged. There is apparent total atelectasis of the left lower lobe and a small fluid level dependently. 2. No CT findings of the abdomen or pelvis to explain blood in stool. 3.  Midline abdominal wound with small volume ascites and anasarca. 4.  Other chronic and incidental findings as detailed above. Electronically Signed    By: Lauralyn PrimesAlex  Bibbey M.D.   On: 02/08/2019 18:08   Dg Chest Portable 1 View  Result Date: 01/10/2019 CLINICAL DATA:  Chest tube placement EXAM: PORTABLE CHEST 1 VIEW COMPARISON:  CT abdomen dated 10/06/2018 FINDINGS: There has been interval placement of a left-sided chest tube. The tube terminates near the left lung apex. The previously noted left-sided pneumothorax has substantially improved from prior study. There may be a trace residual pneumothorax at the left lung base and near the left lung apex. Several lucencies project over the right upper lung field and are favored to represent skin folds as opposed to a pneumothorax. The tracheostomy tube terminates above the carina. Dense atelectasis is again noted at the left lung base. Aortic calcifications are again seen. The right costophrenic angle is not fully visualized. There may be a trace right-sided pleural effusion. There are old healed left-sided rib fractures. No definite radiographic evidence for an acute displaced fracture. IMPRESSION: 1. Interval placement of a left-sided chest tube that is well positioned with the pigtail terminating at the left lung apex. 2. Probable residual small left-sided pneumothorax, not well  evaluated secondary to supine positioning. 3. Persistent dense retrocardiac opacity favored to represent atelectasis. 4. Persistent small bilateral pleural effusions. Electronically Signed   By: Constance Holster M.D.   On: 01/10/2019 21:48    Procedures CHEST TUBE INSERTION  Date/Time: 02/07/2019 9:40 PM Performed by: Lonzo Candy, MD Authorized by: Lonzo Candy, MD   Consent:    Consent obtained:  Verbal   Consent given by:  Patient   Risks discussed:  Bleeding, damage to surrounding structures, incomplete drainage, infection, nerve damage and pain   Alternatives discussed:  No treatment and observation Pre-procedure details:    Skin preparation:  ChloraPrep   Preparation: Patient was prepped and draped in the usual  sterile fashion   Anesthesia (see MAR for exact dosages):    Anesthesia method:  Local infiltration   Local anesthetic:  Lidocaine 1% w/o epi Procedure details:    Placement location:  L lateral   Scalpel size:  11   Dissection instrument:  Finger   Ultrasound guidance: no     Tube connected to:  Suction   Drainage characteristics:  Air only   Suture material:  2-0 silk   Dressing:  4x4 sterile gauze Post-procedure details:    Post-insertion x-ray findings: tube in good position     Patient tolerance of procedure:  Tolerated well, no immediate complications   (including critical care time)  Medications Ordered in ED Medications - No data to display   Initial Impression / Assessment and Plan / ED Course  I have reviewed the triage vital signs and the nursing notes.  Pertinent labs & imaging results that were available during my care of the patient were reviewed by me and considered in my medical decision making (see chart for details).        Medical Decision Making: Benjamin [name] is a 63 y.o. male who presented to the ED today with concerns for pneumothorax.  Reviewed and confirmed nursing documentation for past medical history, family history, social history.  On my initial exam, the pt was alert and interactive, trach in place, nonverbal, cooperative, conversant, tachycardic, not hypotensive, afebrile, asymmetric breath sounds, CT abdomen pelvis today for rectal bleeding as an inpatient showed large left pneumothorax Chest tube placed at bedside, please see procedure note for details, patient tolerated procedure well All radiology and laboratory studies reviewed independently and with my attending physician, agree with reading provided by radiologist unless otherwise noted.  Upon reassessing patient, patient was calm, no new complaints tachycardia resolved. Patient taken back to inpatient floor. The above care was discussed with and agreed upon by my attending  physician. Emergency Department Medication Summary:  Medications - No data to display  Final Clinical Impressions(s) / ED Diagnoses   Final diagnoses:  Primary spontaneous pneumothorax  Chest tube in place    ED Discharge Orders    None       Lonzo Candy, MD 01/10/2019 2204    Carmin Muskrat, MD 02/05/19 2106

## 2019-02-04 NOTE — ED Triage Notes (Signed)
Pt brought down from [facility] by RN for chest tube placement. Pt in vent dependent and currently has TPN running through central line. 98.5, RR 26, HR 109, 98% vent.

## 2019-02-04 NOTE — ED Notes (Signed)
Bedside report given to Sherlynn Stalls, RN at [facility]

## 2019-02-04 NOTE — Progress Notes (Signed)
Pulmonary Critical Care Medicine [facility]   PULMONARY CRITICAL CARE SERVICE  PROGRESS NOTE  Date of Service: 01/18/2019  Benjamin LedgerRick E Mantz  ZOX:[id number]  DOB: [birth year]   DOA: 12/14/2018  Referring Physician: Carron CurieAli Hijazi, MD  HPI: Benjamin [name] is a 63 y.o. male seen for follow up of Acute on Chronic Respiratory Failure.  Patient still failing RSB I remains on the ventilator right now full support on assist control 60% FiO2  Medications: Reviewed on Rounds  Physical Exam:  Vitals: Temperature 98.6 pulse 116 respiratory rate 25 blood pressure 141/62 saturations 97%  Ventilator Settings mode ventilation assist control FiO2 60% tidal volume is 450 PEEP 14  . General: Comfortable at this time . Eyes: Grossly normal lids, irises & conjunctiva . ENT: grossly tongue is normal . Neck: no obvious mass . Cardiovascular: S1 S2 normal no gallop . Respiratory: No rhonchi no rales are noted at this time . Abdomen: soft . Skin: no rash seen on limited exam . Musculoskeletal: not rigid . Psychiatric:unable to assess . Neurologic: no seizure no involuntary movements         Lab Data:   Basic Metabolic Panel: Recent Labs  Lab 01/31/19 0728 02/01/19 0731 02/03/19 0245 02/03/19 2023  NA  --  144 140  --   K  --  3.6 5.3* 4.9  CL  --  103 101  --   CO2  --  29 23  --   GLUCOSE  --  170* 187*  --   BUN  --  72* 103*  --   CREATININE  --  0.64 1.33*  --   CALCIUM  --  8.1* 8.3*  --   MG 1.8 1.9 2.3  --   PHOS 3.9  --  5.9*  --     ABG: No results for input(s): PHART, PCO2ART, PO2ART, HCO3, O2SAT in the last 168 hours.  Liver Function Tests: Recent Labs  Lab 02/01/19 0731  AST 46*  ALT 126*  ALKPHOS 60  BILITOT 0.5  PROT 4.3*  ALBUMIN 2.0*   No results for input(s): LIPASE, AMYLASE in the last 168 hours. No results for input(s): AMMONIA in the last 168 hours.  CBC: Recent Labs  Lab 02/01/19 0731  02/02/19 0937 02/02/19 1637 02/02/19 1708  02/03/19 0245 02/03/19 0949  WBC 17.0*  --  20.8*  --  24.4* 23.9* 26.7*  HGB 5.4*   < > 5.0* 6.4* 6.4* 8.5* 8.8*  HCT 17.4*   < > 16.5* 20.5* 20.9* 25.5* 28.2*  MCV 105.5*  --  108.6*  --  99.5 97.3 97.6  PLT 153  --  246  --  188 184 179   < > = values in this interval not displayed.    Cardiac Enzymes: No results for input(s): CKTOTAL, CKMB, CKMBINDEX, TROPONINI in the last 168 hours.  BNP (last 3 results) No results for input(s): BNP in the last 8760 hours.  ProBNP (last 3 results) No results for input(s): PROBNP in the last 8760 hours.  Radiological Exams: No results found.  Assessment/Plan Active Problems:   Acute on chronic respiratory failure with hypoxia (HCC)   Acute renal failure due to tubular necrosis (HCC)   Strangulated inguinal hernia   Necrotizing pneumonia (HCC)   Paroxysmal atrial fibrillation with rapid ventricular response (HCC)   ARDS (adult respiratory distress syndrome) (HCC)   Severe sepsis (HCC)   1. Acute on chronic respiratory failure with hypoxia continue with full support on the  ventilator patient's not able to do any weaning echo is still pending 2. Acute renal failure follow labs supportive care 3. Strangulated hernia status post repair 4. Necrotizing pneumonia treated follow radiologically 5. ARDS clinically is resolved 6. Severe sepsis hemodynamically is improved 7. Paroxysmal atrial fibrillation rate is controlled   I have personally seen and evaluated the patient, evaluated laboratory and imaging results, formulated the assessment and plan and placed orders. The Patient requires high complexity decision making for assessment and support.  Case was discussed on Rounds with the Respiratory Therapy Staff  Allyne Gee, MD [facility] Pulmonary Critical Care Medicine Sleep Medicine

## 2019-02-04 NOTE — Discharge Instructions (Addendum)

## 2019-02-04 NOTE — ED Notes (Signed)
Time out performed. Pt unable to sign consent due to neuro status.

## 2019-02-05 ENCOUNTER — Inpatient Hospital Stay (HOSPITAL_COMMUNITY): Admit: 2019-02-05 | Payer: Self-pay

## 2019-02-05 DIAGNOSIS — J9621 Acute and chronic respiratory failure with hypoxia: Secondary | ICD-10-CM | POA: Diagnosis not present

## 2019-02-05 DIAGNOSIS — J80 Acute respiratory distress syndrome: Secondary | ICD-10-CM | POA: Diagnosis not present

## 2019-02-05 DIAGNOSIS — J850 Gangrene and necrosis of lung: Secondary | ICD-10-CM

## 2019-02-05 DIAGNOSIS — N170 Acute kidney failure with tubular necrosis: Secondary | ICD-10-CM | POA: Diagnosis not present

## 2019-02-05 DIAGNOSIS — I480 Paroxysmal atrial fibrillation: Secondary | ICD-10-CM

## 2019-02-05 DIAGNOSIS — A419 Sepsis, unspecified organism: Secondary | ICD-10-CM

## 2019-02-05 DIAGNOSIS — R6520 Severe sepsis without septic shock: Secondary | ICD-10-CM

## 2019-02-05 DIAGNOSIS — K4030 Unilateral inguinal hernia, with obstruction, without gangrene, not specified as recurrent: Secondary | ICD-10-CM

## 2019-02-05 NOTE — Progress Notes (Addendum)
[facility] Critical [facility]   PULMONARY CRITICAL CARE SERVICE  PROGRESS NOTE  Date of Service: 02/05/2019  Benjamin LedgerRick E [name]  ZOX:[id number]  DOB: 1956-05-19   DOA: 2019-03-29  Referring Physician: Carron CurieAli Hijazi, MD  HPI: Benjamin [name] is a 63 y.o. male seen for follow up of Acute on Chronic Respiratory Failure.  Patient remains on full support on the ventilator at this time.  Prognosis remains poor.  Currently on assist control mode requiring a PEEP of 14 and FiO2 of 90%.  Medications: Reviewed on Rounds  Physical Exam:  Vitals: Pulse 81 respirations 24 BP 148/62 O2 sat 96% temp 98.3  Ventilator Settings ventilator mode AC VC rate 26 tidal line 450 PEEP of 14 FiO2 of 90%  . General: Comfortable at this time . Eyes: Grossly normal lids, irises & conjunctiva . ENT: grossly tongue is normal . Neck: no obvious mass . Cardiovascular: S1 S2 normal no gallop . Respiratory: No rales or rhonchi noted . Abdomen: soft . Skin: no rash seen on limited exam . Musculoskeletal: not rigid . Psychiatric:unable to assess . Neurologic: no seizure no involuntary movements         Lab Data:   Basic Metabolic Panel: Recent Labs  Lab 01/31/19 0728 02/01/19 0731 02/03/19 0245 02/03/19 2023  NA  --  144 140  --   K  --  3.6 5.3* 4.9  CL  --  103 101  --   CO2  --  29 23  --   GLUCOSE  --  170* 187*  --   BUN  --  72* 103*  --   CREATININE  --  0.64 1.33*  --   CALCIUM  --  8.1* 8.3*  --   MG 1.8 1.9 2.3  --   PHOS 3.9  --  5.9*  --     ABG: No results for input(s): PHART, PCO2ART, PO2ART, HCO3, O2SAT in the last 168 hours.  Liver Function Tests: Recent Labs  Lab 02/01/19 0731  AST 46*  ALT 126*  ALKPHOS 60  BILITOT 0.5  PROT 4.3*  ALBUMIN 2.0*   No results for input(s): LIPASE, AMYLASE in the last 168 hours. No results for input(s): AMMONIA in the last 168 hours.  CBC: Recent Labs  Lab 02/01/19 0731  02/02/19 0937 02/02/19 1637  02/02/19 1708 02/03/19 0245 02/03/19 0949  WBC 17.0*  --  20.8*  --  24.4* 23.9* 26.7*  HGB 5.4*   < > 5.0* 6.4* 6.4* 8.5* 8.8*  HCT 17.4*   < > 16.5* 20.5* 20.9* 25.5* 28.2*  MCV 105.5*  --  108.6*  --  99.5 97.3 97.6  PLT 153  --  246  --  188 184 179   < > = values in this interval not displayed.    Cardiac Enzymes: No results for input(s): CKTOTAL, CKMB, CKMBINDEX, TROPONINI in the last 168 hours.  BNP (last 3 results) No results for input(s): BNP in the last 8760 hours.  ProBNP (last 3 results) No results for input(s): PROBNP in the last 8760 hours.  Radiological Exams: Ct Abdomen Pelvis W Contrast  Result Date: 2019-03-29 CLINICAL DATA:  Blood in stool EXAM: CT ABDOMEN AND PELVIS WITH CONTRAST TECHNIQUE: Multidetector CT imaging of the abdomen and pelvis was performed using the standard protocol following bolus administration of intravenous contrast. CONTRAST:  100mL OMNIPAQUE IOHEXOL 300 MG/ML  SOLN COMPARISON:  None. FINDINGS: Lower chest: There is a large left pneumothorax, incompletely imaged.  There is apparent total atelectasis of the left lower lobe and a small fluid level dependently. Hepatobiliary: No solid liver abnormality is seen. Small gallstone in the dependent gallbladder. No gallbladder wall thickening, or biliary dilatation. Pancreas: Unremarkable. No pancreatic ductal dilatation or surrounding inflammatory changes. Spleen: Normal in size without significant abnormality. Adrenals/Urinary Tract: Adrenal glands are unremarkable. Kidneys are normal, without renal calculi, solid lesion, or hydronephrosis. Foley catheter in the urinary bladder. Stomach/Bowel: Stomach is within normal limits. Appendix appears normal. No evidence of bowel wall thickening, distention, or inflammatory changes. Vascular/Lymphatic: Aortic atherosclerosis. 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 No enlarged abdominal or pelvic lymph nodes. Reproductive:  No mass or other significant abnormality. Other: Small volume ascites.  Midline abdominal wound.  Anasarca. Musculoskeletal: No acute or significant osseous findings. IMPRESSION: 1. There is a large left pneumothorax, incompletely imaged. There is apparent total atelectasis of the left lower lobe and a small fluid level dependently. 2. No CT findings of the abdomen or pelvis to explain blood in stool. 3.  Midline abdominal wound with small volume ascites and anasarca. 4.  Other chronic and incidental findings as detailed above. Electronically Signed   By: Eddie Candle M.D.   On: 01/13/2019 18:08   Dg Chest Portable 1 View  Result Date: 01/14/2019 CLINICAL DATA:  Chest tube placement EXAM: PORTABLE CHEST 1 VIEW COMPARISON:  CT abdomen dated 10/06/2018 FINDINGS: There has been interval placement of a left-sided chest tube. The tube terminates near the left lung apex. The previously noted left-sided pneumothorax has substantially improved from prior study. There may be a trace residual pneumothorax at the left lung base and near the left lung apex. Several lucencies project over the right upper lung field and are favored to represent skin folds as opposed to a pneumothorax. The tracheostomy tube terminates above the carina. Dense atelectasis is again noted at the left lung base. Aortic calcifications are again seen. The right costophrenic angle is not fully visualized. There may be a trace right-sided pleural effusion. There are old healed left-sided rib fractures. No definite radiographic evidence for an acute displaced fracture. IMPRESSION: 1. Interval placement of a left-sided chest tube that is well positioned with the pigtail terminating at the left lung apex. 2. Probable residual small left-sided pneumothorax, not well evaluated secondary to supine positioning. 3. Persistent dense retrocardiac opacity favored to represent atelectasis. 4. Persistent small bilateral pleural effusions. Electronically Signed    By: Constance Holster M.D.   On: 02/03/2019 21:48    Assessment/Plan Active Problems:   Acute on chronic respiratory failure with hypoxia (HCC)   Acute renal failure due to tubular necrosis (HCC)   Strangulated inguinal hernia   Necrotizing pneumonia (HCC)   Paroxysmal atrial fibrillation with rapid ventricular response (HCC)   ARDS (adult respiratory distress syndrome) (HCC)   Severe sepsis ([place])   1. Acute on chronic respiratory failure with hypoxia patient will continue with full support on the ventilator.  Respiratory therapy will continue to attempt weaning patient's PEEP and FiO2.  Continue supportive measures pulmonary toilet. 2. Acute renal failure follow labs supportive care 3. Strangulated hernia status post repair 4. Necrotizing pneumonia treated follow radiologically 5. ARDS clinically is resolved 6. Severe sepsis hemodynamically is improved 7. Paroxysmal atrial fibrillation rate is controlled   I have personally seen and evaluated the patient, evaluated laboratory and imaging results, formulated the assessment and plan and placed orders. The Patient requires high complexity decision making for assessment  and support.  Case was discussed on Rounds with the Respiratory Therapy Staff  Allyne Gee, MD [facility] Pulmonary Critical Care Medicine Sleep Medicine

## 2019-02-06 ENCOUNTER — Other Ambulatory Visit (HOSPITAL_COMMUNITY): Payer: Self-pay

## 2019-02-06 ENCOUNTER — Other Ambulatory Visit (HOSPITAL_BASED_OUTPATIENT_CLINIC_OR_DEPARTMENT_OTHER): Payer: Self-pay

## 2019-02-06 DIAGNOSIS — N170 Acute kidney failure with tubular necrosis: Secondary | ICD-10-CM | POA: Diagnosis not present

## 2019-02-06 DIAGNOSIS — J80 Acute respiratory distress syndrome: Secondary | ICD-10-CM | POA: Diagnosis not present

## 2019-02-06 DIAGNOSIS — J850 Gangrene and necrosis of lung: Secondary | ICD-10-CM | POA: Diagnosis not present

## 2019-02-06 DIAGNOSIS — I361 Nonrheumatic tricuspid (valve) insufficiency: Secondary | ICD-10-CM

## 2019-02-06 DIAGNOSIS — J9621 Acute and chronic respiratory failure with hypoxia: Secondary | ICD-10-CM | POA: Diagnosis not present

## 2019-02-06 NOTE — Progress Notes (Addendum)
[facility]   PULMONARY CRITICAL CARE SERVICE  PROGRESS NOTE  Date of Service: 02/06/2019  Benjamin LedgerRick E Train  ZOX:[id number]  DOB: 1955-12-04   DOA: 02/02/2019  Referring Physician: Carron CurieAli Hijazi, MD  HPI: Benjamin [name] is a 63 y.o. male seen for follow up of Acute on Chronic Respiratory Failure.  Patient remains on full support on the ventilator at this time currently requiring PEEP of 10 with an FiO2 of 60%.  Satting well at this time.  Medications: Reviewed on Rounds  Physical Exam:  Vitals: Pulse 76 respirations 27 BP 106/57 O2 sat 1% temp 97.8  Ventilator Settings ventilator mode AC VC rate of 26 tidal volume 450 PEEP of 10 FiO2 of 60%  . General: Comfortable at this time . Eyes: Grossly normal lids, irises & conjunctiva . ENT: grossly tongue is normal . Neck: no obvious mass . Cardiovascular: S1 S2 normal no gallop . Respiratory: No rales or rhonchi noted . Abdomen: soft . Skin: no rash seen on limited exam . Musculoskeletal: not rigid . Psychiatric:unable to assess . Neurologic: no seizure no involuntary movements         Lab Data:   Basic Metabolic Panel: Recent Labs  Lab 01/31/19 0728 02/01/19 0731 02/03/19 0245 02/03/19 2023  NA  --  144 140  --   K  --  3.6 5.3* 4.9  CL  --  103 101  --   CO2  --  29 23  --   GLUCOSE  --  170* 187*  --   BUN  --  72* 103*  --   CREATININE  --  0.64 1.33*  --   CALCIUM  --  8.1* 8.3*  --   MG 1.8 1.9 2.3  --   PHOS 3.9  --  5.9*  --     ABG: No results for input(s): PHART, PCO2ART, PO2ART, HCO3, O2SAT in the last 168 hours.  Liver Function Tests: Recent Labs  Lab 02/01/19 0731  AST 46*  ALT 126*  ALKPHOS 60  BILITOT 0.5  PROT 4.3*  ALBUMIN 2.0*   No results for input(s): LIPASE, AMYLASE in the last 168 hours. No results for input(s): AMMONIA in the last 168 hours.  CBC: Recent Labs  Lab 02/01/19 0731  02/02/19 0937 02/02/19 1637 02/02/19 1708  02/03/19 0245 02/03/19 0949  WBC 17.0*  --  20.8*  --  24.4* 23.9* 26.7*  HGB 5.4*   < > 5.0* 6.4* 6.4* 8.5* 8.8*  HCT 17.4*   < > 16.5* 20.5* 20.9* 25.5* 28.2*  MCV 105.5*  --  108.6*  --  99.5 97.3 97.6  PLT 153  --  246  --  188 184 179   < > = values in this interval not displayed.    Cardiac Enzymes: No results for input(s): CKTOTAL, CKMB, CKMBINDEX, TROPONINI in the last 168 hours.  BNP (last 3 results) No results for input(s): BNP in the last 8760 hours.  ProBNP (last 3 results) No results for input(s): PROBNP in the last 8760 hours.  Radiological Exams: Ct Abdomen Pelvis W Contrast  Result Date: 01/14/2019 CLINICAL DATA:  Blood in stool EXAM: CT ABDOMEN AND PELVIS WITH CONTRAST TECHNIQUE: Multidetector CT imaging of the abdomen and pelvis was performed using the standard protocol following bolus administration of intravenous contrast. CONTRAST:  100mL OMNIPAQUE IOHEXOL 300 MG/ML  SOLN COMPARISON:  None. FINDINGS: Lower chest: There is a large left pneumothorax, incompletely imaged. There is  apparent total atelectasis of the left lower lobe and a small fluid level dependently. Hepatobiliary: No solid liver abnormality is seen. Small gallstone in the dependent gallbladder. No gallbladder wall thickening, or biliary dilatation. Pancreas: Unremarkable. No pancreatic ductal dilatation or surrounding inflammatory changes. Spleen: Normal in size without significant abnormality. Adrenals/Urinary Tract: Adrenal glands are unremarkable. Kidneys are normal, without renal calculi, solid lesion, or hydronephrosis. Foley catheter in the urinary bladder. Stomach/Bowel: Stomach is within normal limits. Appendix appears normal. No evidence of bowel wall thickening, distention, or inflammatory changes. Vascular/Lymphatic: Aortic atherosclerosis. 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 No enlarged abdominal or pelvic lymph nodes. Reproductive: No mass or  other significant abnormality. Other: Small volume ascites.  Midline abdominal wound.  Anasarca. Musculoskeletal: No acute or significant osseous findings. IMPRESSION: 1. There is a large left pneumothorax, incompletely imaged. There is apparent total atelectasis of the left lower lobe and a small fluid level dependently. 2. No CT findings of the abdomen or pelvis to explain blood in stool. 3.  Midline abdominal wound with small volume ascites and anasarca. 4.  Other chronic and incidental findings as detailed above. Electronically Signed   By: Lauralyn PrimesAlex  Bibbey M.D.   On: 02/01/2019 18:08   Dg Chest Port 1 View  Result Date: 02/06/2019 CLINICAL DATA:  Pneumothorax EXAM: PORTABLE CHEST 1 VIEW COMPARISON:  01/24/2019 FINDINGS: No significant change in AP portable examination with a left apical and lateral pneumothorax with left-sided pigtail chest tube in position, tip positioned about the left pulmonary apex. There is some lucency about the lateral left lung base that suggests a basal component of pneumothorax that is not resolved, however this is difficult to appreciate with certainty on portable radiographs. Consider CT to further evaluate, especially given presence of significant consolidations on prior CT. There is probable hyperinflation and emphysema. Tracheostomy IMPRESSION: No significant change in AP portable examination with a left apical and lateral pneumothorax with left-sided pigtail chest tube in position, tip positioned about the left pulmonary apex. There is some lucency about the lateral left lung base that suggests a basal component of pneumothorax that is not resolved, however this is difficult to appreciate with certainty on portable radiographs. Consider CT to further evaluate, especially given presence of significant consolidations on prior CT. There is probable hyperinflation and emphysema. Tracheostomy Electronically Signed   By: Lauralyn PrimesAlex  Bibbey M.D.   On: 02/06/2019 13:27   Dg Chest Portable 1  View  Result Date: 01/24/2019 CLINICAL DATA:  Chest tube placement EXAM: PORTABLE CHEST 1 VIEW COMPARISON:  CT abdomen dated 10/06/2018 FINDINGS: There has been interval placement of a left-sided chest tube. The tube terminates near the left lung apex. The previously noted left-sided pneumothorax has substantially improved from prior study. There may be a trace residual pneumothorax at the left lung base and near the left lung apex. Several lucencies project over the right upper lung field and are favored to represent skin folds as opposed to a pneumothorax. The tracheostomy tube terminates above the carina. Dense atelectasis is again noted at the left lung base. Aortic calcifications are again seen. The right costophrenic angle is not fully visualized. There may be a trace right-sided pleural effusion. There are old healed left-sided rib fractures. No definite radiographic evidence for an acute displaced fracture. IMPRESSION: 1. Interval placement of a left-sided chest tube that is well positioned with the pigtail terminating at the left lung apex. 2. Probable residual small left-sided pneumothorax, not well evaluated  secondary to supine positioning. 3. Persistent dense retrocardiac opacity favored to represent atelectasis. 4. Persistent small bilateral pleural effusions. Electronically Signed   By: Constance Holster M.D.   On: 02/08/19 21:48    Assessment/Plan Active Problems:   Acute on chronic respiratory failure with hypoxia (HCC)   Acute renal failure due to tubular necrosis (HCC)   Strangulated inguinal hernia   Necrotizing pneumonia (HCC)   Paroxysmal atrial fibrillation with rapid ventricular response (HCC)   ARDS (adult respiratory distress syndrome) (HCC)   Severe sepsis ([place])   1. Acute on chronic respiratory failure with hypoxia patient will continue with full support on the ventilator.  Continue to wean FiO2 as patient can tolerate.  Continue pulmonary toilet. 2. Acute renal failure  follow labs supportive care 3. Strangulated hernia status post repair 4. Necrotizing pneumonia treated follow radiologically 5. ARDS clinically is resolved 6. Severe sepsis hemodynamically is improved 7. Paroxysmal atrial fibrillation rate is controlled   I have personally seen and evaluated the patient, evaluated laboratory and imaging results, formulated the assessment and plan and placed orders. The Patient requires high complexity decision making for assessment and support.  Case was discussed on Rounds with the Respiratory Therapy Staff  Allyne Gee, MD [facility] Pulmonary Critical Care Medicine Sleep Medicine

## 2019-02-06 NOTE — Progress Notes (Signed)
Echocardiogram 2D Echocardiogram has been performed.  Oneal Deputy Shelton Soler 02/06/2019, 12:08 PM

## 2019-02-07 ENCOUNTER — Other Ambulatory Visit (HOSPITAL_COMMUNITY): Payer: Self-pay

## 2019-02-07 DIAGNOSIS — J9621 Acute and chronic respiratory failure with hypoxia: Secondary | ICD-10-CM | POA: Diagnosis not present

## 2019-02-07 DIAGNOSIS — J850 Gangrene and necrosis of lung: Secondary | ICD-10-CM | POA: Diagnosis not present

## 2019-02-07 DIAGNOSIS — N170 Acute kidney failure with tubular necrosis: Secondary | ICD-10-CM | POA: Diagnosis not present

## 2019-02-07 DIAGNOSIS — J80 Acute respiratory distress syndrome: Secondary | ICD-10-CM | POA: Diagnosis not present

## 2019-02-07 LAB — CBC
HCT: 23.3 % — ABNORMAL LOW (ref 39.0–52.0)
Hemoglobin: 7.5 g/dL — ABNORMAL LOW (ref 13.0–17.0)
MCH: 32.2 pg (ref 26.0–34.0)
MCHC: 32.2 g/dL (ref 30.0–36.0)
MCV: 100 fL (ref 80.0–100.0)
Platelets: 117 10*3/uL — ABNORMAL LOW (ref 150–400)
RBC: 2.33 MIL/uL — ABNORMAL LOW (ref 4.22–5.81)
RDW: 23.1 % — ABNORMAL HIGH (ref 11.5–15.5)
WBC: 10.7 10*3/uL — ABNORMAL HIGH (ref 4.0–10.5)
nRBC: 0.6 % — ABNORMAL HIGH (ref 0.0–0.2)

## 2019-02-07 LAB — BASIC METABOLIC PANEL
Anion gap: 18 — ABNORMAL HIGH (ref 5–15)
BUN: 140 mg/dL — ABNORMAL HIGH (ref 8–23)
CO2: 20 mmol/L — ABNORMAL LOW (ref 22–32)
Calcium: 7.8 mg/dL — ABNORMAL LOW (ref 8.9–10.3)
Chloride: 105 mmol/L (ref 98–111)
Creatinine, Ser: 1.88 mg/dL — ABNORMAL HIGH (ref 0.61–1.24)
GFR calc Af Amer: 43 mL/min — ABNORMAL LOW (ref >60)
GFR calc non Af Amer: 37 mL/min — ABNORMAL LOW (ref >60)
Glucose, Bld: 193 mg/dL — ABNORMAL HIGH (ref 70–99)
Potassium: 4.9 mmol/L (ref 3.5–5.1)
Sodium: 143 mmol/L (ref 135–145)

## 2019-02-07 NOTE — Progress Notes (Signed)
[facility]   PULMONARY CRITICAL CARE SERVICE  PROGRESS NOTE  Date of Service: 02/07/2019  Benjamin [name]  ZOX:[id number]  DOB: Jul 05, 1956   DOA: 10/15/18  Referring Physician: Carron CurieAli Hijazi, MD  HPI: Benjamin LedgerRick E Radi is a 63 y.o. male seen for follow up of Acute on Chronic Respiratory Failure.  Patient currently is on full vent support had a pneumothorax over the weekend which was treated by the emergency department now remains on the ventilator and full support on assist control  Medications: Reviewed on Rounds  Physical Exam:  Vitals: Temperature 96.9 pulse 70 respiratory 28 blood pressure 118/65 saturations 100%  Ventilator Settings patient right now is on assist control FiO2 is 45% tidal volume 450 PEEP 8  . General: Comfortable at this time . Eyes: Grossly normal lids, irises & conjunctiva . ENT: grossly tongue is normal . Neck: no obvious mass . Cardiovascular: S1 S2 normal no gallop . Respiratory: No rhonchi no rales are noted at this time . Abdomen: soft . Skin: no rash seen on limited exam . Musculoskeletal: not rigid . Psychiatric:unable to assess . Neurologic: no seizure no involuntary movements         Lab Data:   Basic Metabolic Panel: Recent Labs  Lab 02/01/19 0731 02/03/19 0245 02/03/19 2023 02/07/19 0807  NA 144 140  --  143  K 3.6 5.3* 4.9 4.9  CL 103 101  --  105  CO2 29 23  --  20*  GLUCOSE 170* 187*  --  193*  BUN 72* 103*  --  140*  CREATININE 0.64 1.33*  --  1.88*  CALCIUM 8.1* 8.3*  --  7.8*  MG 1.9 2.3  --   --   PHOS  --  5.9*  --   --     ABG: No results for input(s): PHART, PCO2ART, PO2ART, HCO3, O2SAT in the last 168 hours.  Liver Function Tests: Recent Labs  Lab 02/01/19 0731  AST 46*  ALT 126*  ALKPHOS 60  BILITOT 0.5  PROT 4.3*  ALBUMIN 2.0*   No results for input(s): LIPASE, AMYLASE in the last 168 hours. No results for input(s): AMMONIA in the last 168  hours.  CBC: Recent Labs  Lab 02/02/19 0937 02/02/19 1637 02/02/19 1708 02/03/19 0245 02/03/19 0949 02/07/19 0807  WBC 20.8*  --  24.4* 23.9* 26.7* 10.7*  HGB 5.0* 6.4* 6.4* 8.5* 8.8* 7.5*  HCT 16.5* 20.5* 20.9* 25.5* 28.2* 23.3*  MCV 108.6*  --  99.5 97.3 97.6 100.0  PLT 246  --  188 184 179 117*    Cardiac Enzymes: No results for input(s): CKTOTAL, CKMB, CKMBINDEX, TROPONINI in the last 168 hours.  BNP (last 3 results) No results for input(s): BNP in the last 8760 hours.  ProBNP (last 3 results) No results for input(s): PROBNP in the last 8760 hours.  Radiological Exams: Dg Chest Port 1 View  Result Date: 02/07/2019 CLINICAL DATA:  Pneumothorax with chest tube EXAM: PORTABLE CHEST 1 VIEW COMPARISON:  Yesterday FINDINGS: Left apical pneumothorax is 2 rib interspaces at the apex, unchanged. There is some gas at the base which is difficult to quantify given its accumulation anteriorly. Stable chest tube with tip at the apex. Tracheostomy tube in place. Emphysema. IMPRESSION: Unchanged left pneumothorax which is moderate when accounting for gas at the apex and base. COPD. Electronically Signed   By: Marnee SpringJonathon  Watts M.D.   On: 02/07/2019 06:35   Dg Chest Port 1  View  Result Date: 02/06/2019 CLINICAL DATA:  Pneumothorax EXAM: PORTABLE CHEST 1 VIEW COMPARISON:  01/11/2019 FINDINGS: No significant change in AP portable examination with a left apical and lateral pneumothorax with left-sided pigtail chest tube in position, tip positioned about the left pulmonary apex. There is some lucency about the lateral left lung base that suggests a basal component of pneumothorax that is not resolved, however this is difficult to appreciate with certainty on portable radiographs. Consider CT to further evaluate, especially given presence of significant consolidations on prior CT. There is probable hyperinflation and emphysema. Tracheostomy IMPRESSION: No significant change in AP portable examination  with a left apical and lateral pneumothorax with left-sided pigtail chest tube in position, tip positioned about the left pulmonary apex. There is some lucency about the lateral left lung base that suggests a basal component of pneumothorax that is not resolved, however this is difficult to appreciate with certainty on portable radiographs. Consider CT to further evaluate, especially given presence of significant consolidations on prior CT. There is probable hyperinflation and emphysema. Tracheostomy Electronically Signed   By: Eddie Candle M.D.   On: 02/06/2019 13:27    Assessment/Plan Active Problems:   Acute on chronic respiratory failure with hypoxia (HCC)   Acute renal failure due to tubular necrosis (HCC)   Strangulated inguinal hernia   Necrotizing pneumonia (HCC)   Paroxysmal atrial fibrillation with rapid ventricular response (HCC)   ARDS (adult respiratory distress syndrome) (HCC)   Severe sepsis ([place])   1. Acute on chronic respiratory failure with hypoxia we will continue with full supportive care patient is not able to wean today we will continue to monitor. 2. Acute renal failure due to ATN we will continue to follow along follow-up on labs 3. Strangulated hernia status post surgery 4. Necrotizing pneumonia patient had a chest x-ray done which had shown no significant change in the examination. 5. Paroxysmal atrial fibrillation rate controlled 6. ARDS clinically resolved 7. Severe sepsis hemodynamically stable we will continue with supportive care 8. Pneumothorax chest tube remains in place   I have personally seen and evaluated the patient, evaluated laboratory and imaging results, formulated the assessment and plan and placed orders. The Patient requires high complexity decision making for assessment and support.  Case was discussed on Rounds with the Respiratory Therapy Staff  Allyne Gee, MD [facility] Pulmonary Critical Care Medicine Sleep Medicine

## 2019-02-08 DIAGNOSIS — N170 Acute kidney failure with tubular necrosis: Secondary | ICD-10-CM | POA: Diagnosis not present

## 2019-02-08 DIAGNOSIS — J9621 Acute and chronic respiratory failure with hypoxia: Secondary | ICD-10-CM | POA: Diagnosis not present

## 2019-02-08 DIAGNOSIS — J850 Gangrene and necrosis of lung: Secondary | ICD-10-CM | POA: Diagnosis not present

## 2019-02-08 DIAGNOSIS — J80 Acute respiratory distress syndrome: Secondary | ICD-10-CM | POA: Diagnosis not present

## 2019-02-08 NOTE — Progress Notes (Addendum)
Pulmonary Critical Care Medicine [place]   PULMONARY CRITICAL CARE SERVICE  PROGRESS NOTE  Date of Service: 02/08/2019  Benjamin [name]  ASN:[id number]  DOB: 02-Jul-1956   DOA: 01/17/2019  Referring Physician: Merton Border, MD  HPI: Benjamin [name] is a 63 y.o. male seen for follow up of Acute on Chronic Respiratory Failure.  Patient is currently on comfort care his trach was decannulated and he is on 2 L of oxygen via nasal cannula at this time.  Medications: Reviewed on Rounds  Physical Exam:  Vitals: Pulse 75 respirations 20 BP 110/53 O2 sat 82% temp 96.3  Ventilator Settings 2 L nasal cannula  . General: Comfortable at this time . Eyes: Grossly normal lids, irises & conjunctiva . ENT: grossly tongue is normal . Neck: no obvious mass . Cardiovascular: S1 S2 normal no gallop . Respiratory: No rales or rhonchi noted . Abdomen: soft . Skin: no rash seen on limited exam . Musculoskeletal: not rigid . Psychiatric:unable to assess . Neurologic: no seizure no involuntary movements         Lab Data:   Basic Metabolic Panel: Recent Labs  Lab 02/03/19 0245 02/03/19 2023 02/07/19 0807  NA 140  --  143  K 5.3* 4.9 4.9  CL 101  --  105  CO2 23  --  20*  GLUCOSE 187*  --  193*  BUN 103*  --  140*  CREATININE 1.33*  --  1.88*  CALCIUM 8.3*  --  7.8*  MG 2.3  --   --   PHOS 5.9*  --   --     ABG: No results for input(s): PHART, PCO2ART, PO2ART, HCO3, O2SAT in the last 168 hours.  Liver Function Tests: No results for input(s): AST, ALT, ALKPHOS, BILITOT, PROT, ALBUMIN in the last 168 hours. No results for input(s): LIPASE, AMYLASE in the last 168 hours. No results for input(s): AMMONIA in the last 168 hours.  CBC: Recent Labs  Lab 02/02/19 0937 02/02/19 1637 02/02/19 1708 02/03/19 0245 02/03/19 0949 02/07/19 0807  WBC 20.8*  --  24.4* 23.9* 26.7* 10.7*  HGB 5.0* 6.4* 6.4* 8.5* 8.8* 7.5*  HCT 16.5* 20.5* 20.9* 25.5* 28.2* 23.3*  MCV  108.6*  --  99.5 97.3 97.6 100.0  PLT 246  --  188 184 179 117*    Cardiac Enzymes: No results for input(s): CKTOTAL, CKMB, CKMBINDEX, TROPONINI in the last 168 hours.  BNP (last 3 results) No results for input(s): BNP in the last 8760 hours.  ProBNP (last 3 results) No results for input(s): PROBNP in the last 8760 hours.  Radiological Exams: Dg Chest Port 1 View  Result Date: 02/07/2019 CLINICAL DATA:  Pneumothorax with chest tube EXAM: PORTABLE CHEST 1 VIEW COMPARISON:  Yesterday FINDINGS: Left apical pneumothorax is 2 rib interspaces at the apex, unchanged. There is some gas at the base which is difficult to quantify given its accumulation anteriorly. Stable chest tube with tip at the apex. Tracheostomy tube in place. Emphysema. IMPRESSION: Unchanged left pneumothorax which is moderate when accounting for gas at the apex and base. COPD. Electronically Signed   By: Monte Fantasia M.D.   On: 02/07/2019 06:35    Assessment/Plan Active Problems:   Acute on chronic respiratory failure with hypoxia (HCC)   Acute renal failure due to tubular necrosis (HCC)   Strangulated inguinal hernia   Necrotizing pneumonia (HCC)   Paroxysmal atrial fibrillation with rapid ventricular response (HCC)   ARDS (adult respiratory distress syndrome) (  HCC)   Severe sepsis (HCC)   1. Acute on chronic respiratory failure with hypoxia continue supportive measures time.  Patient is comfort care currently with trach decannulated and on 2 L oxygen by nasal cannula. 2. Acute renal failure due to ATN we will continue to follow along follow-up on labs 3. Strangulated hernia status post surgery 4. Necrotizing pneumonia patient had a chest x-ray done which had shown no significant change in the examination. 5. Paroxysmal atrial fibrillation rate controlled 6. ARDS clinically resolved 7. Severe sepsis hemodynamically stable we will continue with supportive care 8. Pneumothorax chest tube remains in place   I  have personally seen and evaluated the patient, evaluated laboratory and imaging results, formulated the assessment and plan and placed orders. The Patient requires high complexity decision making for assessment and support.  Case was discussed on Rounds with the Respiratory Therapy Staff  Yevonne PaxSaadat A Khan, MD [facility] Pulmonary Critical Care Medicine Sleep Medicine

## 2019-02-09 DEATH — deceased

## 2019-02-10 LAB — FUNGUS CULTURE RESULT

## 2019-02-10 LAB — FUNGUS CULTURE WITH STAIN

## 2019-02-10 LAB — FUNGAL ORGANISM REFLEX

## 2019-02-21 LAB — ACID FAST CULTURE WITH REFLEXED SENSITIVITIES (MYCOBACTERIA): Acid Fast Culture: NEGATIVE

## 2019-03-12 DEATH — deceased

## 2020-07-08 IMAGING — DX PORTABLE CHEST - 1 VIEW
1 series · 2 of 2 positions shown · non-contrast
Comparison: January 17, 2019

CLINICAL DATA: Acute on chronic respiratory failure.

EXAM:
PORTABLE CHEST 1 VIEW

[Series 1: chest · 0.14mm/px · 2 of 2 slices shown]
[im 1/2]
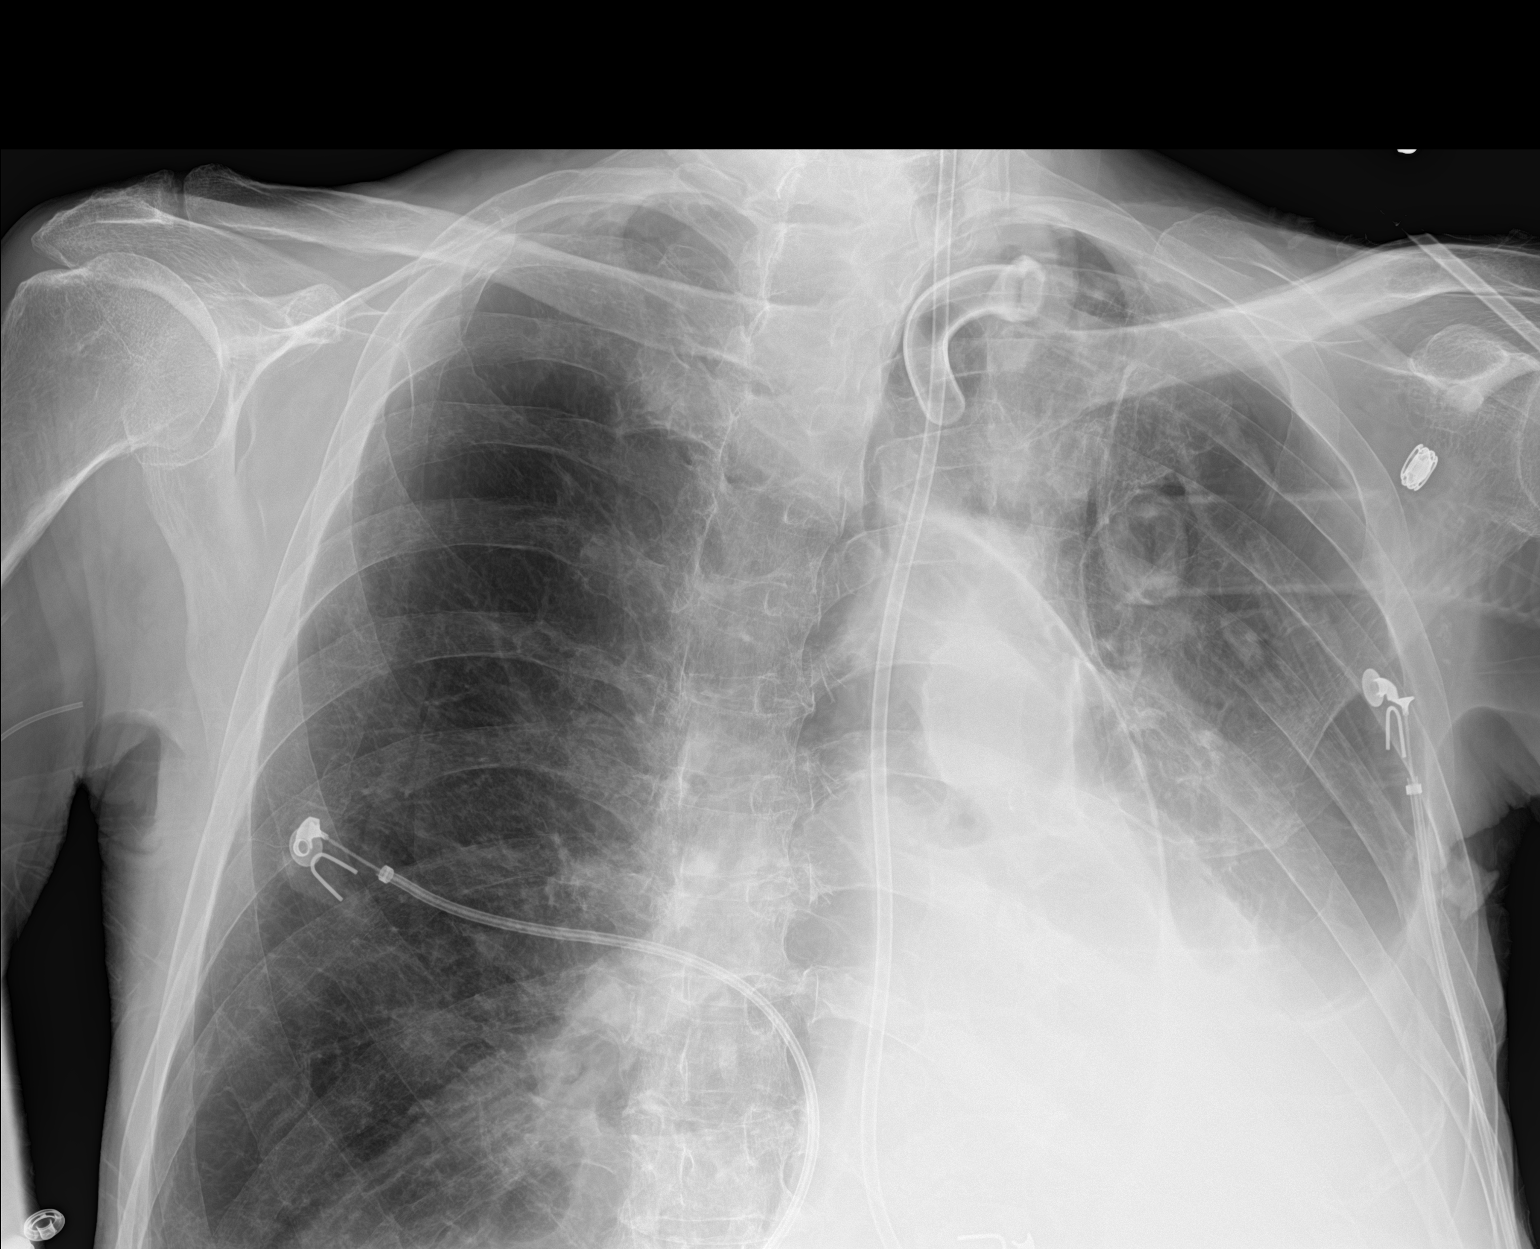
[im 2/2]
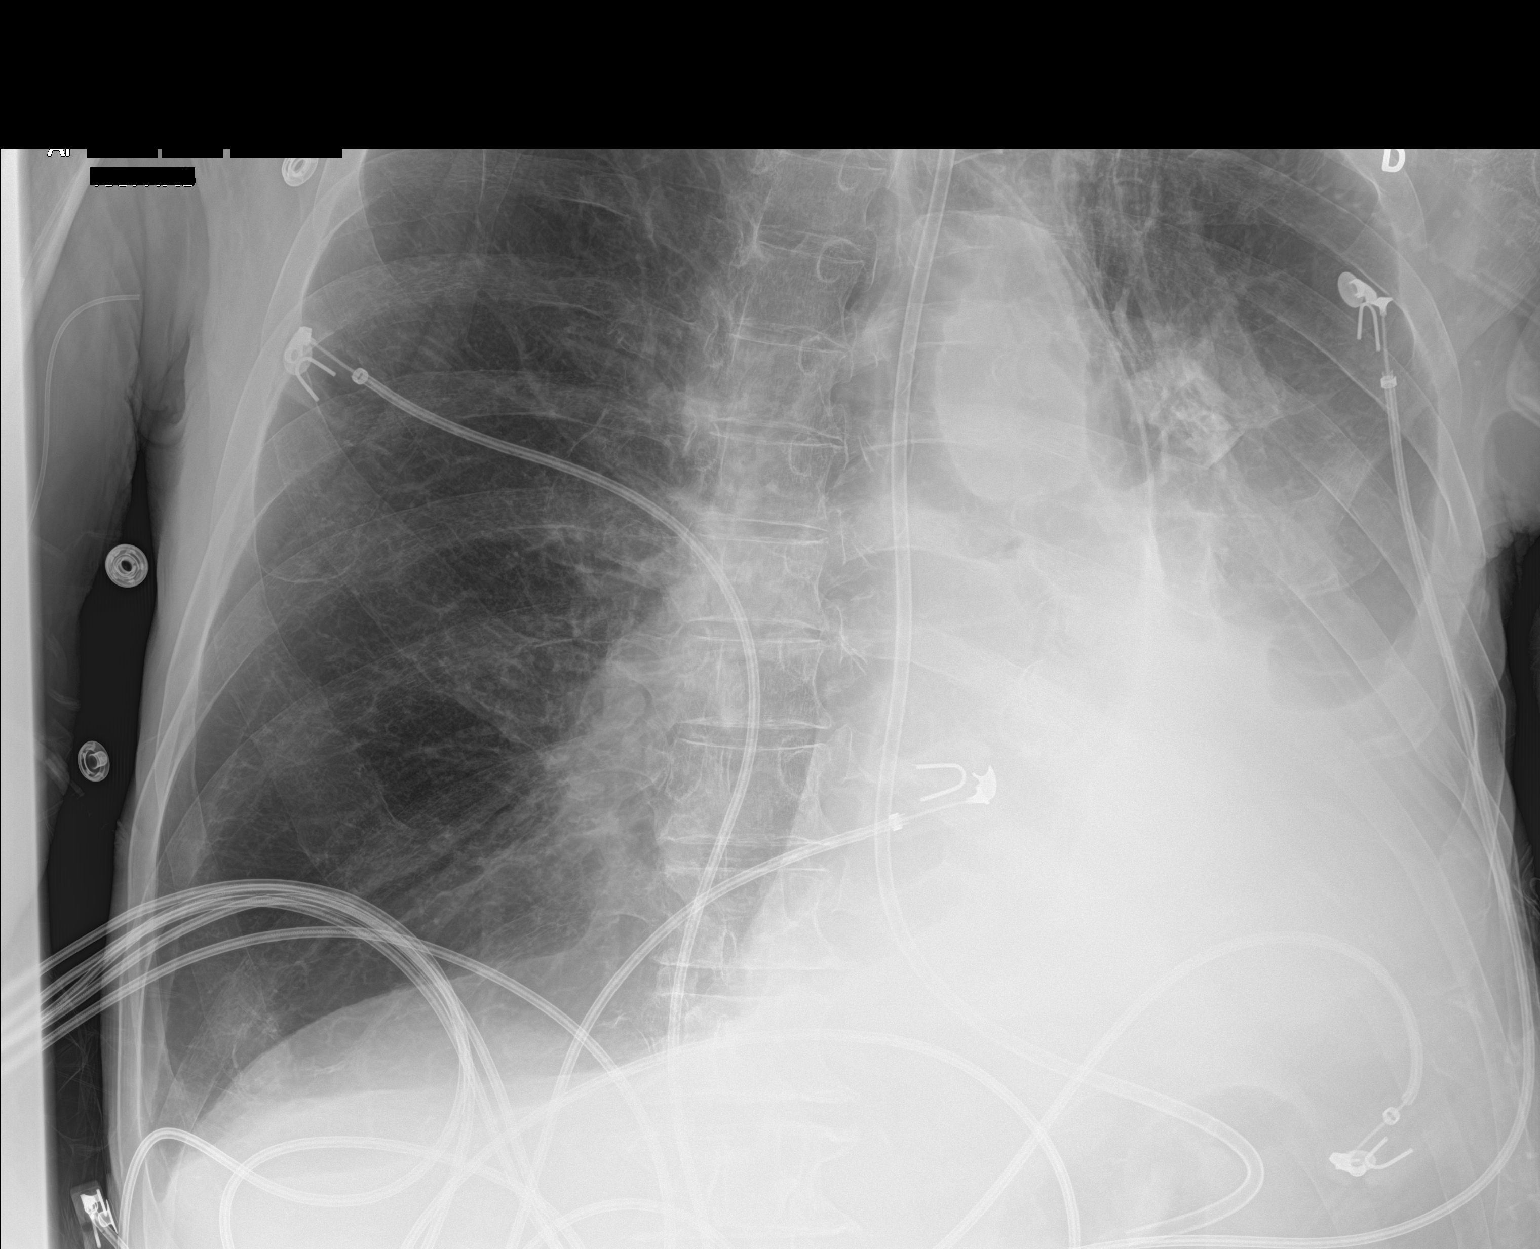

[2 of 2 positions shown; findings below may reference images not displayed]

FINDINGS: The tracheostomy tube is stable. A feeding tube terminates below
today's film. No pneumothorax. A moderate left-sided pleural
effusion with underlying opacity is stable. The heart and
mediastinum are shifted to the left, probably due to patient
rotation and left-sided atelectasis. The right lung is clear. No
other acute abnormalities.
IMPRESSION: Moderate left-sided pleural effusion with underlying opacity, larger
in the interval. No other abnormalities.

## 2020-07-11 IMAGING — DX ABDOMEN - 1 VIEW
1 series · 1 of 1 positions shown · non-contrast
Comparison: 12/27/2018

CLINICAL DATA: PICC line placement

EXAM:
ABDOMEN - 1 VIEW

[abdomen kub]
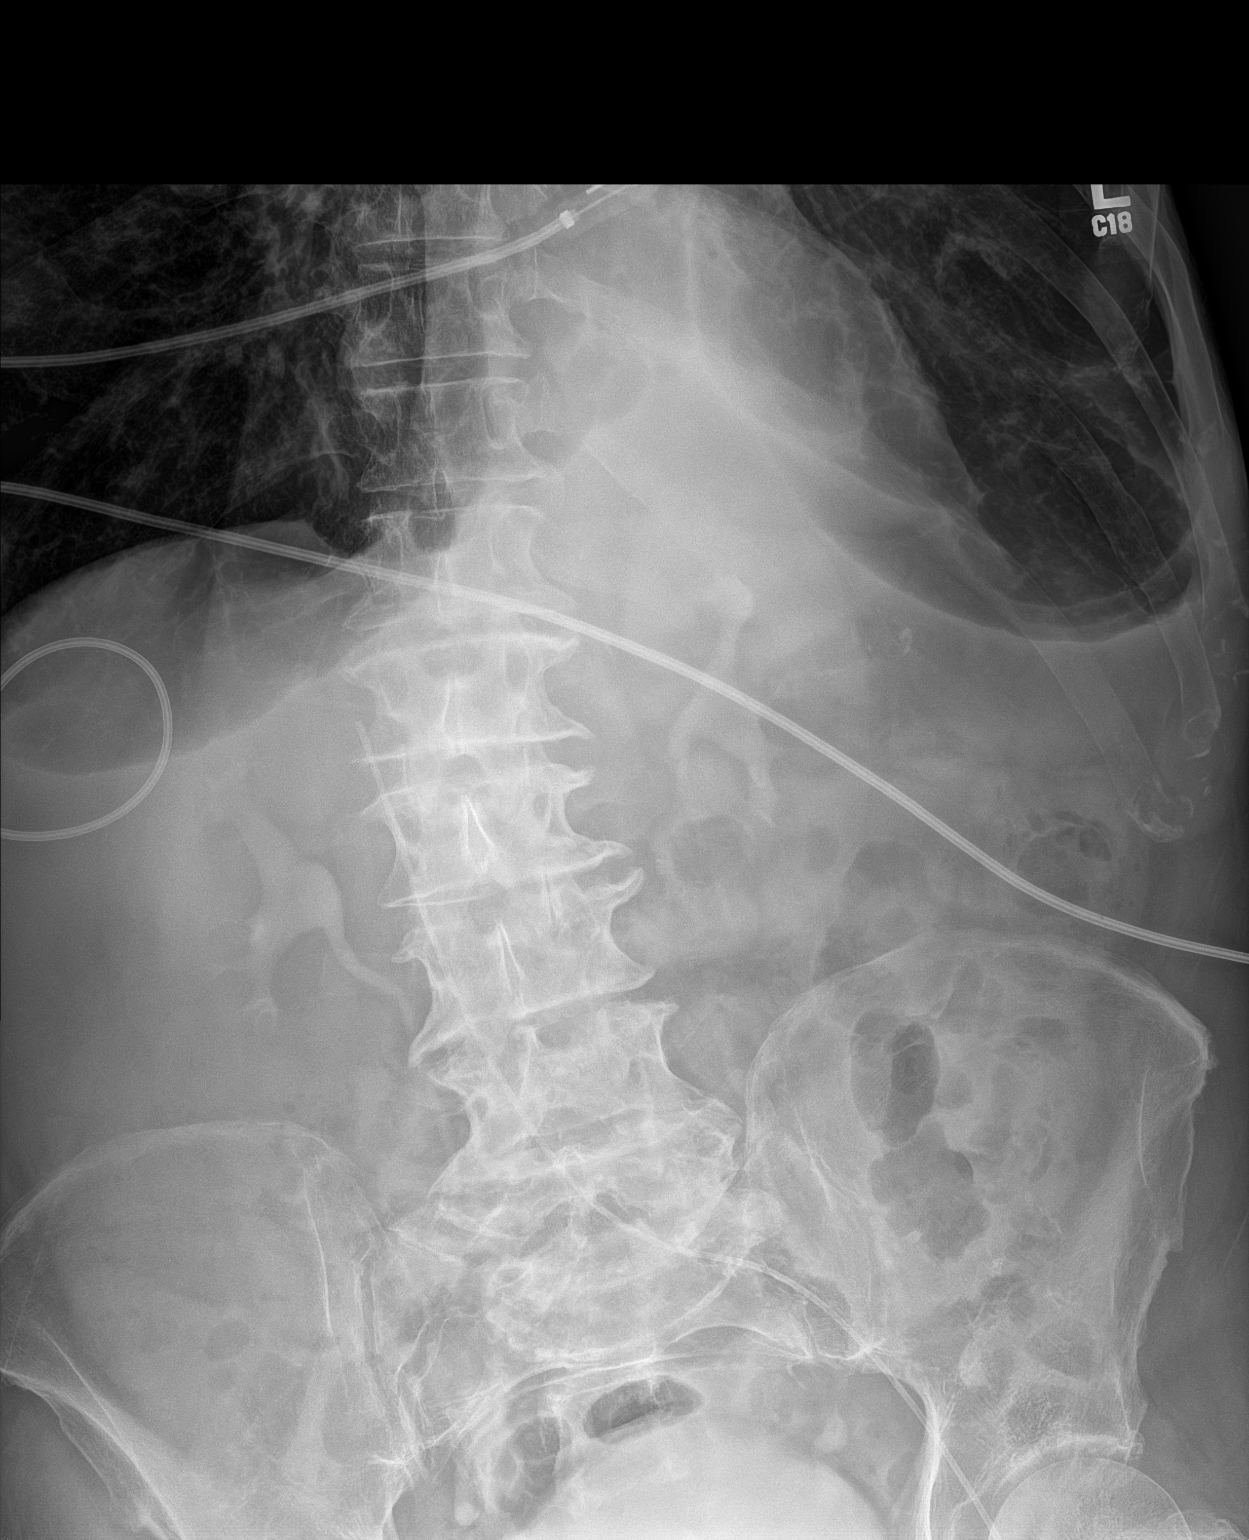

[1 of 1 positions shown; findings below may reference images not displayed]

FINDINGS: The patient's left femoral approach PICC line terminates at the L1
level, approximately 5 cm from the cavoatrial junction. The bowel
gas pattern is nonspecific and nonobstructive. Contrast is noted
within the kidneys from prior contrast enhanced examination. The
osseous structures are stable from prior study.
IMPRESSION: PICC line as above.  Nonobstructive bowel gas pattern.

## 2020-07-11 IMAGING — DX PORTABLE CHEST - 1 VIEW
2 series · 2 of 2 positions shown · non-contrast
Comparison: 01/22/2019 chest radiograph.

CLINICAL DATA: Respiratory distress

EXAM:
PORTABLE CHEST 1 VIEW

[chest ap (1 of 2)]
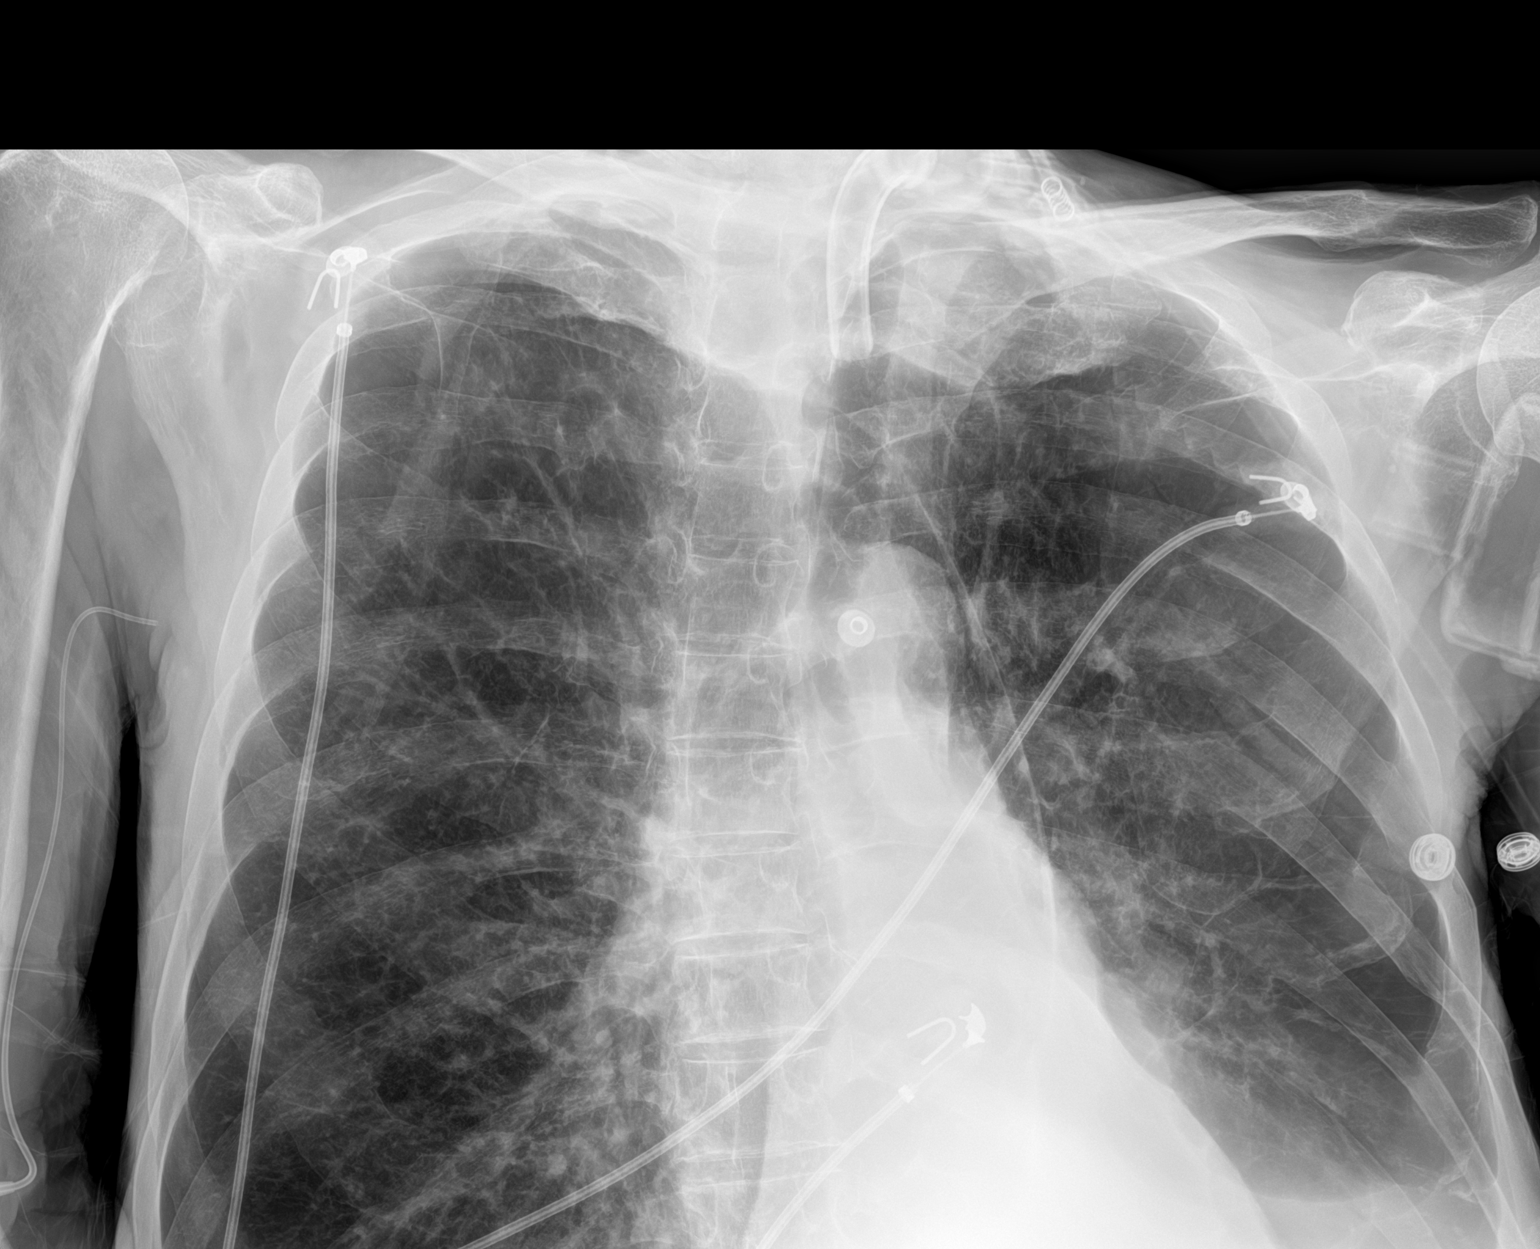

[chest ap (2 of 2)]
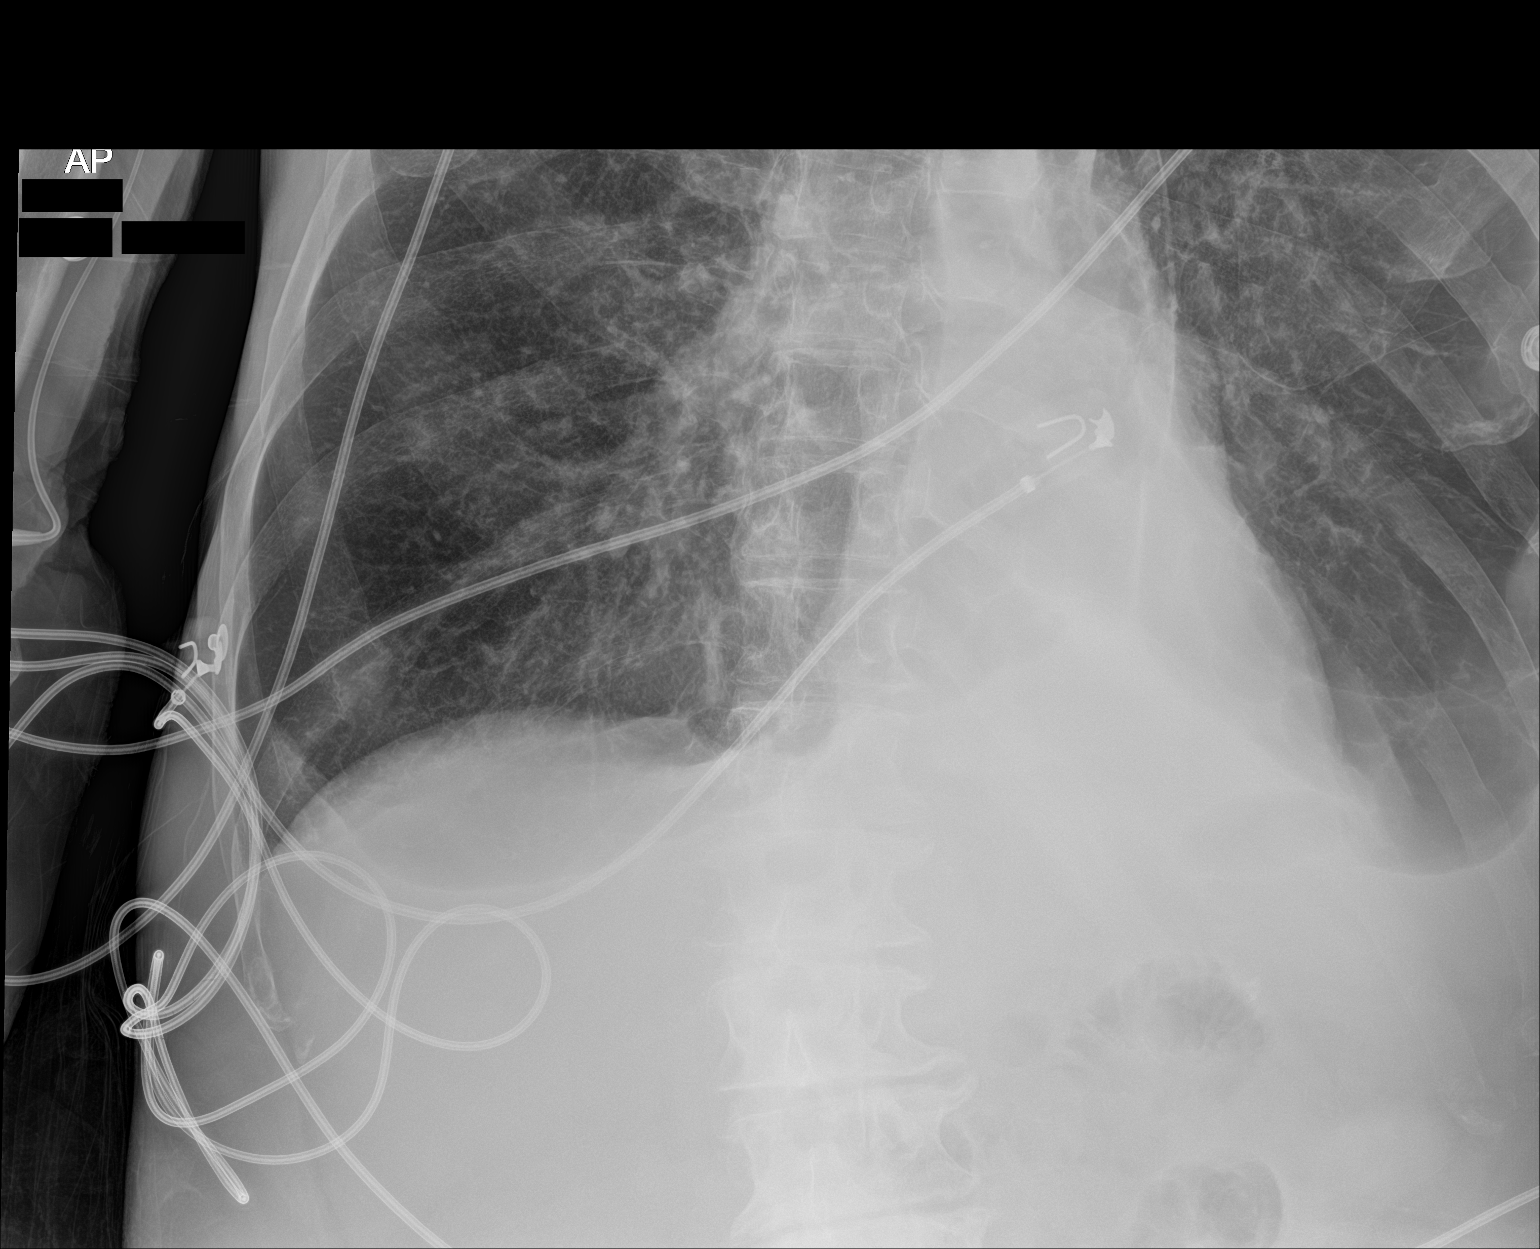

[2 of 2 positions shown; findings below may reference images not displayed]

FINDINGS: Tracheostomy tube terminates over the tracheal air column just below
the thoracic inlet. Left rotated chest radiograph. Stable
cardiomediastinal silhouette with normal heart size. No
pneumothorax. Stable small left pleural effusion. Stable left lung
base opacity. Hyperinflated lungs. No pulmonary edema.
IMPRESSION: 1. Stable small left pleural effusion and left basilar lung opacity,
favor atelectasis.
2. Hyperinflated lungs, suggesting COPD.

## 2020-07-18 IMAGING — DX PORTABLE CHEST - 1 VIEW
1 series · 2 of 2 positions shown · non-contrast
Comparison: Chest x-ray 01/24/2019, chest CT 01/24/2019

CLINICAL DATA: Pleural effusion

EXAM:
PORTABLE CHEST 1 VIEW

[Series 1: chest · 0.14mm/px · 2 of 2 slices shown]
[im 1/2]
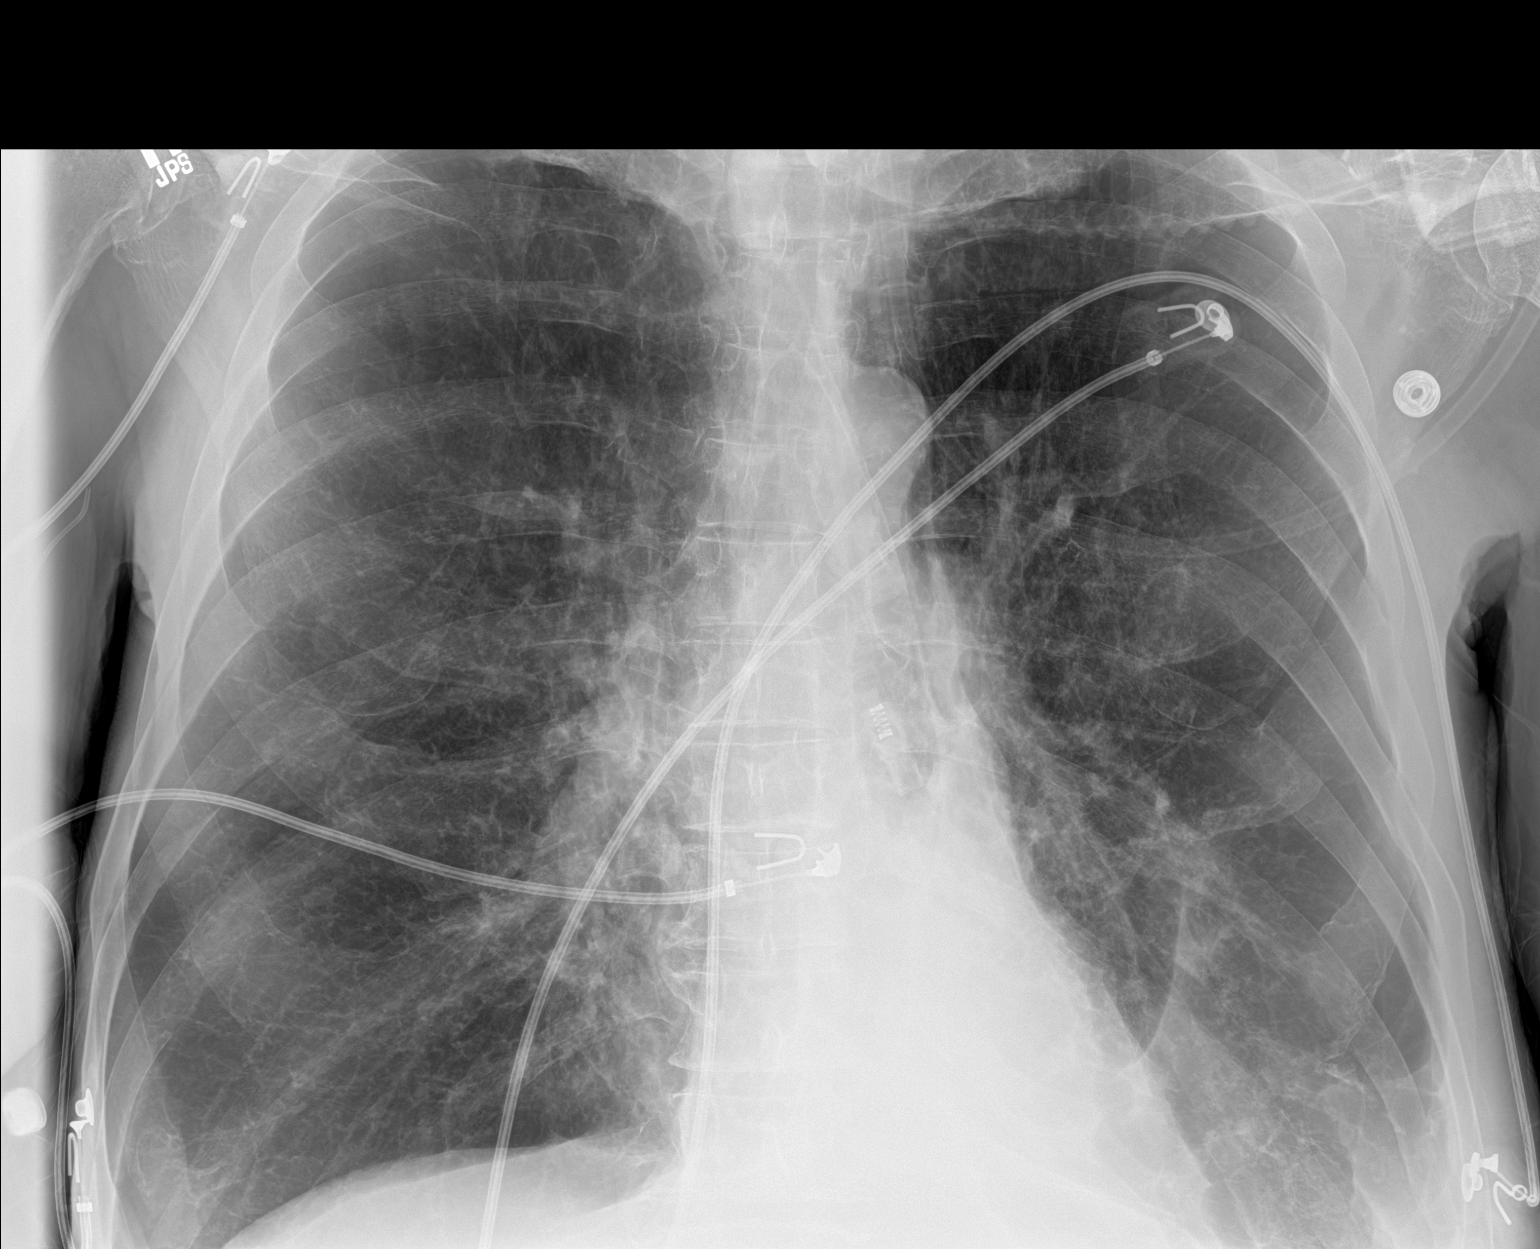
[im 2/2]
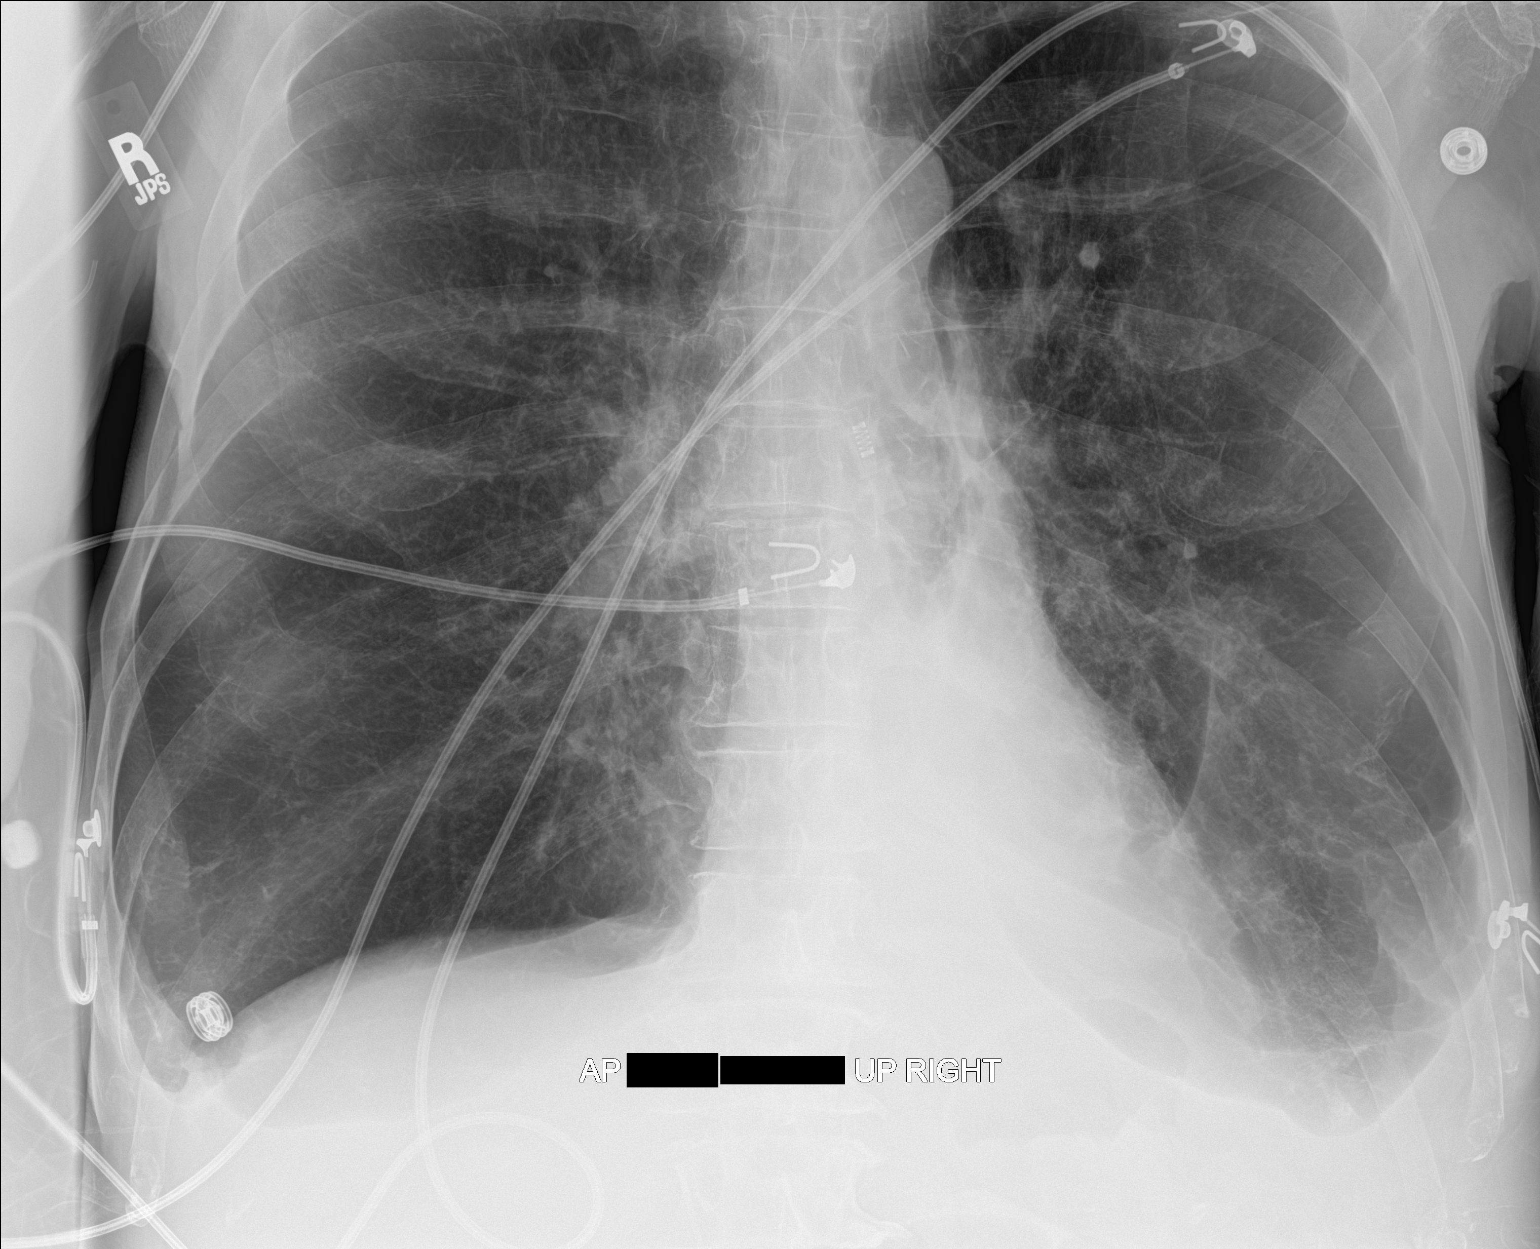

[2 of 2 positions shown; findings below may reference images not displayed]

FINDINGS: The lungs are hyperinflated likely secondary to COPD. There is a
small left pleural effusion. There is no pneumothorax. There is no
focal consolidation. The heart mediastinum stable. No acute osseous
abnormality.
IMPRESSION: 1. Persistent small left pleural effusion.
2. COPD.
